# Patient Record
Sex: Female | Born: 1950 | Race: White | Hispanic: No | Marital: Married | Smoking: Never smoker
Health system: Southern US, Community
[De-identification: ages and names within clinical notes are randomized; demographics above are authoritative.]

## PROBLEM LIST (undated history)

## (undated) DIAGNOSIS — IMO0002 Reserved for concepts with insufficient information to code with codable children: Secondary | ICD-10-CM

## (undated) DIAGNOSIS — N83209 Unspecified ovarian cyst, unspecified side: Secondary | ICD-10-CM

## (undated) DIAGNOSIS — M810 Age-related osteoporosis without current pathological fracture: Secondary | ICD-10-CM

## (undated) DIAGNOSIS — E785 Hyperlipidemia, unspecified: Secondary | ICD-10-CM

## (undated) DIAGNOSIS — F419 Anxiety disorder, unspecified: Secondary | ICD-10-CM

## (undated) HISTORY — DX: Anxiety disorder, unspecified: F41.9

## (undated) HISTORY — PX: DERMOID CYST  EXCISION: SHX1452

## (undated) HISTORY — DX: Hyperlipidemia, unspecified: E78.5

## (undated) HISTORY — DX: Reserved for concepts with insufficient information to code with codable children: IMO0002

## (undated) HISTORY — DX: Unspecified ovarian cyst, unspecified side: N83.209

## (undated) HISTORY — PX: APPENDECTOMY: SHX54

## (undated) HISTORY — DX: Age-related osteoporosis without current pathological fracture: M81.0

## (undated) HISTORY — PX: TUBAL LIGATION: SHX77

---
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
## 1984-01-15 HISTORY — PX: BREAST LUMPECTOMY: SHX2

## 2001-09-01 ENCOUNTER — Other Ambulatory Visit: Admission: RE | Admit: 2001-09-01 | Discharge: 2001-09-01 | Payer: Self-pay | Admitting: Internal Medicine

## 2004-05-18 ENCOUNTER — Ambulatory Visit: Payer: Self-pay | Admitting: Internal Medicine

## 2005-06-07 ENCOUNTER — Ambulatory Visit: Payer: Self-pay | Admitting: Family Medicine

## 2005-07-29 ENCOUNTER — Ambulatory Visit: Payer: Self-pay | Admitting: Family Medicine

## 2005-07-30 ENCOUNTER — Encounter: Payer: Self-pay | Admitting: Family Medicine

## 2005-10-01 ENCOUNTER — Ambulatory Visit: Payer: Self-pay | Admitting: Family Medicine

## 2005-10-07 ENCOUNTER — Ambulatory Visit: Payer: Self-pay | Admitting: Professional

## 2005-10-22 ENCOUNTER — Ambulatory Visit: Payer: Self-pay | Admitting: Professional

## 2006-11-22 ENCOUNTER — Encounter: Payer: Self-pay | Admitting: Family Medicine

## 2007-02-02 ENCOUNTER — Encounter: Payer: Self-pay | Admitting: Family Medicine

## 2007-02-06 ENCOUNTER — Encounter (INDEPENDENT_AMBULATORY_CARE_PROVIDER_SITE_OTHER): Payer: Self-pay | Admitting: *Deleted

## 2007-02-13 ENCOUNTER — Encounter (INDEPENDENT_AMBULATORY_CARE_PROVIDER_SITE_OTHER): Payer: Self-pay | Admitting: *Deleted

## 2007-11-13 ENCOUNTER — Other Ambulatory Visit: Admission: RE | Admit: 2007-11-13 | Discharge: 2007-11-13 | Payer: Self-pay | Admitting: Internal Medicine

## 2007-11-13 ENCOUNTER — Encounter: Payer: Self-pay | Admitting: Internal Medicine

## 2007-11-13 ENCOUNTER — Ambulatory Visit: Payer: Self-pay | Admitting: Internal Medicine

## 2007-11-13 DIAGNOSIS — E785 Hyperlipidemia, unspecified: Secondary | ICD-10-CM

## 2007-11-13 DIAGNOSIS — F411 Generalized anxiety disorder: Secondary | ICD-10-CM | POA: Insufficient documentation

## 2007-11-16 LAB — CONVERTED CEMR LAB
Cholesterol: 288 mg/dL (ref 0–200)
Total CHOL/HDL Ratio: 4
Triglycerides: 165 mg/dL — ABNORMAL HIGH (ref 0–149)

## 2007-11-18 ENCOUNTER — Encounter (INDEPENDENT_AMBULATORY_CARE_PROVIDER_SITE_OTHER): Payer: Self-pay | Admitting: *Deleted

## 2007-12-09 ENCOUNTER — Ambulatory Visit: Payer: Self-pay | Admitting: Gastroenterology

## 2007-12-24 ENCOUNTER — Ambulatory Visit: Payer: Self-pay | Admitting: Gastroenterology

## 2008-03-16 ENCOUNTER — Encounter: Payer: Self-pay | Admitting: Internal Medicine

## 2008-03-21 ENCOUNTER — Encounter: Payer: Self-pay | Admitting: Internal Medicine

## 2008-07-21 ENCOUNTER — Ambulatory Visit: Payer: Self-pay | Admitting: Internal Medicine

## 2009-05-04 ENCOUNTER — Encounter: Payer: Self-pay | Admitting: Internal Medicine

## 2009-05-08 ENCOUNTER — Encounter: Payer: Self-pay | Admitting: Internal Medicine

## 2009-05-09 ENCOUNTER — Encounter: Payer: Self-pay | Admitting: Internal Medicine

## 2009-05-15 ENCOUNTER — Encounter: Payer: Self-pay | Admitting: Internal Medicine

## 2009-05-15 LAB — HM MAMMOGRAPHY: HM Mammogram: NORMAL

## 2009-11-07 ENCOUNTER — Encounter: Payer: Self-pay | Admitting: Internal Medicine

## 2009-11-20 ENCOUNTER — Encounter: Payer: Self-pay | Admitting: Internal Medicine

## 2010-02-13 NOTE — Letter (Signed)
Summary: Results Follow up Letter  [place] [facility]  [address] [place], [place] [address]   Phone: [phone]  Fax: [phone]    05/15/2009 MRN: [id number]  Charlotte Wright [address] [place], [place]  [address]  Dear Ms. Degrazia,  The following are the results of your recent test(s):  Test         Result    Pap Smear:        Normal _____  Not Normal _____ Comments: ______________________________________________________ Cholesterol: LDL(Bad cholesterol):         Your goal is less than:         HDL (Good cholesterol):       Your goal is more than: Comments:  ______________________________________________________ Mammogram:        Normal __X___  Not Normal _____ Comments: make sure that patient knows the follow up views looked good and were reassuring but they would like to check again in 6 months.  ___________________________________________________________________ Hemoccult:        Normal _____  Not normal _______ Comments:    _____________________________________________________________________ Other Tests:    We routinely do not discuss normal results over the telephone.  If you desire a copy of the results, or you have any questions about this information we can discuss them at your next office visit.   Sincerely,      Tillman Abide, MD

## 2010-03-20 ENCOUNTER — Telehealth: Payer: Self-pay | Admitting: Internal Medicine

## 2010-03-27 NOTE — Progress Notes (Signed)
Summary: pt needs medicine for travelor's diarrhea  Phone Note Call from Patient Call back at Home Phone [phone]   Caller: Patient Summary of Call: Pt is going to [place] the week of 3/19 and is asking that cipro be called in for travelor's diarrhea.  [facility]  Please let pt know when called in. Initial call taken by: Lowella Petties CMA, AAMA,  March 20, 2010 11:21 AM  Follow-up for Phone Call        okay to send cipro 250 two times a day  #14 x 0 Use only if symptoms develop--not as a preventative Follow-up by: Cindee Salt MD,  March 21, 2010 8:37 AM  Additional Follow-up for Phone Call Additional follow up Details #1::        Spoke with patient and advised results. rx sent to pharmacy. Additional Follow-up by: Mervin Hack CMA (AAMA),  March 21, 2010 10:24 AM    New/Updated Medications: CIPRO 250 MG TABS (CIPROFLOXACIN HCL) two times a day Prescriptions: CIPRO 250 MG TABS (CIPROFLOXACIN HCL) two times a day  #14 x 0   Entered by:   Mervin Hack CMA (AAMA)   Authorized by:   Cindee Salt MD   Signed by:   Mervin Hack CMA (AAMA) on 03/21/2010   Method used:   Electronically to        [id number] (retail[facility]       [address]       [place], [place]  [address]       Ph: [phone] or [phone]       Fax: [phone]   RxID:   [id number]

## 2010-05-25 ENCOUNTER — Encounter: Payer: Self-pay | Admitting: *Deleted

## 2010-05-25 ENCOUNTER — Encounter: Payer: Self-pay | Admitting: Internal Medicine

## 2011-06-05 ENCOUNTER — Encounter: Payer: Self-pay | Admitting: Internal Medicine

## 2011-06-06 ENCOUNTER — Telehealth: Payer: Self-pay | Admitting: Internal Medicine

## 2011-06-06 NOTE — Telephone Encounter (Signed)
I spoke to radiologist His concern is persistent sheets of microcalcifications on mammo prompting repeat diagnostics regularly No recent change and no cluster to warrant biopsy  BSGI has high specificity so unlikely to give false alarm and could be reassuring  Discussed with patient as well and she would like to proceed  Will okay the test now  Shirlee Limerick, Please have [facility] send me another order and I will sign

## 2011-06-06 NOTE — Telephone Encounter (Signed)
Dr.Cornella,Radiology,called to speak to you.  He wanted to talk to you about the questions you have about patient's order.  Please call Dr.Cornella on his cell phone [phone].

## 2011-06-15 HISTORY — PX: BREAST BIOPSY: SHX20

## 2011-06-20 ENCOUNTER — Other Ambulatory Visit: Payer: Self-pay | Admitting: Radiology

## 2011-06-26 ENCOUNTER — Encounter: Payer: Self-pay | Admitting: Internal Medicine

## 2011-08-15 ENCOUNTER — Encounter: Payer: Self-pay | Admitting: Internal Medicine

## 2011-08-15 DIAGNOSIS — IMO0002 Reserved for concepts with insufficient information to code with codable children: Secondary | ICD-10-CM

## 2011-08-15 HISTORY — DX: Reserved for concepts with insufficient information to code with codable children: IMO0002

## 2011-08-16 ENCOUNTER — Encounter: Payer: Self-pay | Admitting: Internal Medicine

## 2011-09-05 ENCOUNTER — Encounter: Payer: Self-pay | Admitting: Internal Medicine

## 2011-09-10 ENCOUNTER — Encounter: Payer: Self-pay | Admitting: Internal Medicine

## 2011-09-10 ENCOUNTER — Other Ambulatory Visit (HOSPITAL_COMMUNITY)
Admission: RE | Admit: 2011-09-10 | Discharge: 2011-09-10 | Disposition: A | Discharge status: Home / Self Care | Payer: BC Managed Care – PPO | Source: Ambulatory Visit | Attending: Internal Medicine | Admitting: Internal Medicine

## 2011-09-10 ENCOUNTER — Ambulatory Visit (INDEPENDENT_AMBULATORY_CARE_PROVIDER_SITE_OTHER): Payer: BC Managed Care – PPO | Admitting: Internal Medicine

## 2011-09-10 VITALS — BP 110/70 | HR 78 | Temp 98.1°F | Ht 66.0 in | Wt 166.0 lb

## 2011-09-10 DIAGNOSIS — Z01419 Encounter for gynecological examination (general) (routine) without abnormal findings: Secondary | ICD-10-CM | POA: Insufficient documentation

## 2011-09-10 DIAGNOSIS — Z0000 Encounter for general adult medical examination without abnormal findings: Secondary | ICD-10-CM

## 2011-09-10 DIAGNOSIS — E785 Hyperlipidemia, unspecified: Secondary | ICD-10-CM

## 2011-09-10 DIAGNOSIS — Z23 Encounter for immunization: Secondary | ICD-10-CM

## 2011-09-10 DIAGNOSIS — Z1151 Encounter for screening for human papillomavirus (HPV): Secondary | ICD-10-CM | POA: Insufficient documentation

## 2011-09-10 LAB — CBC WITH DIFFERENTIAL/PLATELET
Eosinophils Relative: 2.8 % (ref 0.0–5.0)
HCT: 37.7 % (ref 36.0–46.0)
Lymphs Abs: 1.7 10*3/uL (ref 0.7–4.0)
Monocytes Relative: 7.6 % (ref 3.0–12.0)
Platelets: 272 10*3/uL (ref 150.0–400.0)
WBC: 4.8 10*3/uL (ref 4.5–10.5)

## 2011-09-10 LAB — HEPATIC FUNCTION PANEL
AST: 18 U/L (ref 0–37)
Alkaline Phosphatase: 43 U/L (ref 39–117)
Bilirubin, Direct: 0 mg/dL (ref 0.0–0.3)
Total Bilirubin: 0.4 mg/dL (ref 0.3–1.2)

## 2011-09-10 LAB — LIPID PANEL
Cholesterol: 239 mg/dL — ABNORMAL HIGH (ref 0–200)
Total CHOL/HDL Ratio: 4
VLDL: 28.6 mg/dL (ref 0.0–40.0)

## 2011-09-10 LAB — BASIC METABOLIC PANEL
Chloride: 105 mEq/L (ref 96–112)
Creatinine, Ser: 0.8 mg/dL (ref 0.4–1.2)
Sodium: 140 mEq/L (ref 135–145)

## 2011-09-10 LAB — TSH: TSH: 0.97 u[IU]/mL (ref 0.35–5.50)

## 2011-09-10 NOTE — Assessment & Plan Note (Signed)
Will recheck with better lifestyle No meds

## 2011-09-10 NOTE — Patient Instructions (Signed)
Please check to see if your insurance covers the shingles vaccine (Zostavax)

## 2011-09-10 NOTE — Progress Notes (Signed)
Subjective:    Patient ID: Charlotte Wright, female    DOB: 1950/11/14, 61 y.o.   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    Allergies  Allergen Reactions  . Penicillins     REACTION: rash  . Sulfa Antibiotics Rash    Past Medical History  Diagnosis Date  . Anxiety   . Hyperlipidemia   . Ovarian cyst 1970's    right    Past Surgical History  Procedure Date  . Vaginal delivery     x5  . Tubal ligation   . Breast lumpectomy 1986    right    Family History  Problem Relation Age of Onset  . Cancer Neg Hx     colon  . Diabetes Neg Hx     History   Social History  . Marital Status: Married    Spouse Name: N/A    Number of Children: 5  . Years of Education: N/A   Occupational History  . English teacher --on leave now    Social History Main Topics  . Smoking status: Never Smoker   . Smokeless tobacco: Never Used  . 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 Drug Use: No  . Sexually Active: Not on file   Other Topics Concern  . 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       Wears seat belt Lots of gardening and yard work [employer] a lot  HENT: Negative for hearing loss, congestion, rhinorrhea, dental problem and tinnitus.        Regular with dentist  Eyes: Negative for visual disturbance.       No diplopia or unilateral vision loss    Respiratory: Negative for cough, chest tightness and shortness of breath.   Cardiovascular: Negative for chest pain, palpitations and leg swelling.  Gastrointestinal: Negative for nausea, vomiting, abdominal pain, constipation and blood in stool.       No heartburn  Genitourinary: Negative for dysuria, difficulty urinating and dyspareunia.  Musculoskeletal: Positive for arthralgias. Negative for back pain and joint swelling.       Mild left thumb pain  Skin: Negative for rash.       No suspicious lesions Very sensitive to bug bites  Neurological: Negative for dizziness, syncope, weakness, light-headedness, numbness and headaches.  Hematological: Negative for adenopathy. Does not bruise/bleed easily.  Psychiatric/Behavioral: Negative for disturbed wake/sleep cycle and dysphoric mood. The patient is not nervous/anxious.        Objective:   Physical Exam  Constitutional: She is oriented to person, place, and time. She appears well-developed and well-nourished. No distress.  HENT:  Head: Normocephalic and atraumatic.  Right Ear: External ear normal.  Left Ear: External ear normal.  Mouth/Throat: Oropharynx is clear and moist. No oropharyngeal exudate.  Eyes: Conjunctivae and EOM are normal.  Pupils are equal, round, and reactive to light.  Neck: Normal range of motion. Neck supple. No thyromegaly present.  Cardiovascular: Normal rate, regular rhythm, normal heart sounds and intact distal pulses.  Exam reveals no gallop.   No murmur heard. Pulmonary/Chest: Effort normal and breath sounds normal. No respiratory distress. She has no wheezes. She has no rales.  Abdominal: Soft. There is no tenderness.  Genitourinary:       Mild cystic changes in left breast 2 lumps laterally in right breast (site of biopsy in June)--with mild tenderness  Normal introitus Cervix appears normal---pap done Bimanual negative. Uterus is retroverted  Musculoskeletal: Normal range of motion. She exhibits no  edema and no tenderness.  Lymphadenopathy:    She has no cervical adenopathy.    She has no axillary adenopathy.  Neurological: She is alert and oriented to person, place, and time.  Skin: No rash noted. No erythema.  Psychiatric: She has a normal mood and affect. Her behavior is normal.          Assessment & Plan:

## 2011-09-10 NOTE — Assessment & Plan Note (Signed)
Doing well Will update Tdap She will look into zostavax Pap done Just had mammo and breast biopsy on right Abstinent from alcohol again and exercising regularly

## 2011-09-11 ENCOUNTER — Encounter: Payer: Self-pay | Admitting: *Deleted

## 2011-09-18 ENCOUNTER — Encounter: Payer: Self-pay | Admitting: Internal Medicine

## 2011-09-20 ENCOUNTER — Encounter: Payer: Self-pay | Admitting: *Deleted

## 2011-09-24 ENCOUNTER — Encounter: Payer: Self-pay | Admitting: *Deleted

## 2011-12-25 ENCOUNTER — Telehealth: Payer: Self-pay | Admitting: *Deleted

## 2011-12-25 NOTE — Telephone Encounter (Signed)
Left message with female that answered the phone and asked him to have pt return my call, need to make sure she knows of the benign findings on her mammo and she needs a 6 mth f/u. Per results from [facility] pt was already given the letter but I need to confirm.  Sending results to be scanned.

## 2012-01-07 ENCOUNTER — Encounter: Payer: Self-pay | Admitting: Internal Medicine

## 2012-01-13 ENCOUNTER — Ambulatory Visit (INDEPENDENT_AMBULATORY_CARE_PROVIDER_SITE_OTHER): Payer: BC Managed Care – PPO | Admitting: Internal Medicine

## 2012-01-13 ENCOUNTER — Encounter: Payer: Self-pay | Admitting: Internal Medicine

## 2012-01-13 VITALS — BP 120/72 | HR 74 | Temp 98.9°F | Resp 14 | Wt 158.0 lb

## 2012-01-13 DIAGNOSIS — J069 Acute upper respiratory infection, unspecified: Secondary | ICD-10-CM

## 2012-01-13 MED ORDER — HYDROCODONE-HOMATROPINE 5-1.5 MG/5ML PO SYRP: 5.0000 mL | ORAL_SOLUTION | Freq: Every evening | ORAL | Status: DC | PRN

## 2012-01-13 MED ORDER — AZITHROMYCIN 250 MG PO TABS: ORAL_TABLET | ORAL | Status: DC

## 2012-01-13 NOTE — Patient Instructions (Signed)
Please start the azithromycin antibiotic if you are worsening in the next few days

## 2012-01-13 NOTE — Assessment & Plan Note (Signed)
Severity suggests influenza despite the fact that she had the vaccine Now with bronchitis symptoms Will give hydrocodone for cough Azithromycin if worsening in the next few days

## 2012-01-13 NOTE — Progress Notes (Signed)
  Subjective:    Patient ID: Charlotte Wright, female    DOB: [birth year], 61 y.o.   MRN: [id number]  HPI Started with scratchy throat a week ago Aching in back also Then 4 days ago, woke with fever and headache Was in bed mostly for 3 days  Now with bad cough Especially bad at night Sleeping with vaporizer Awakening with bad cough  Still feels weak  Cough is mostly dry No apparent post nasal drip No sore throat Slight teeth pain but not in ears  Tried robitussin DM--didn't seem to help Did try some tylenol for sleep (PM) a couple of nights ago Headache is only low grade now Some chills  No current outpatient prescriptions on file prior to visit.    Allergies  Allergen Reactions  . Penicillins     REACTION: rash  . Sulfa Antibiotics Rash    Past Medical History  Diagnosis Date  . Anxiety   . Hyperlipidemia   . Ovarian cyst 1970's    right  . ASCUS favor benign 8/13    Past Surgical History  Procedure Date  . Vaginal delivery     x5  . Tubal ligation   . Breast lumpectomy 1986    right  . Breast biopsy 6/13    negative    Family History  Problem Relation Age of Onset  . Cancer Neg Hx     colon  . Diabetes Neg Hx     History   Social History  . Marital Status: Married    Spouse Name: N/A    Number of Children: 5  . Years of Education: N/A   Occupational History  . English teacher --on leave now    Social History Main Topics  . Smoking status: Never Smoker   . Smokeless tobacco: Never Used  . 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 Drug Use: No  . Sexually Active: Not on file   Other Topics Concern  . 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 No distress.  HENT:    Mouth/Throat: Oropharynx is clear and moist. No oropharyngeal exudate.       No sinus tenderness Only very mild nasal congestion on right TMs normal  Neck: Normal range of motion. Neck supple. No thyromegaly present.  Pulmonary/Chest: No respiratory distress. She has no wheezes. She has no rales.       Scattered rhonchi No dullness to percussion  Lymphadenopathy:    She has no cervical adenopathy.          Assessment & Plan:

## 2012-01-14 ENCOUNTER — Encounter: Payer: Self-pay | Admitting: Internal Medicine

## 2012-03-01 ENCOUNTER — Other Ambulatory Visit: Payer: Self-pay

## 2012-06-05 ENCOUNTER — Encounter: Payer: Self-pay | Admitting: Internal Medicine

## 2012-06-11 ENCOUNTER — Encounter: Payer: Self-pay | Admitting: *Deleted

## 2012-09-11 ENCOUNTER — Ambulatory Visit (INDEPENDENT_AMBULATORY_CARE_PROVIDER_SITE_OTHER): Payer: BC Managed Care – PPO | Admitting: Internal Medicine

## 2012-09-11 ENCOUNTER — Other Ambulatory Visit (HOSPITAL_COMMUNITY)
Admission: RE | Admit: 2012-09-11 | Discharge: 2012-09-11 | Disposition: A | Discharge status: Home / Self Care | Payer: BC Managed Care – PPO | Source: Ambulatory Visit | Attending: Internal Medicine | Admitting: Internal Medicine

## 2012-09-11 ENCOUNTER — Encounter: Payer: Self-pay | Admitting: Internal Medicine

## 2012-09-11 VITALS — BP 122/70 | HR 57 | Temp 98.4°F | Ht 66.0 in | Wt 154.0 lb

## 2012-09-11 DIAGNOSIS — E785 Hyperlipidemia, unspecified: Secondary | ICD-10-CM

## 2012-09-11 DIAGNOSIS — Z01419 Encounter for gynecological examination (general) (routine) without abnormal findings: Secondary | ICD-10-CM | POA: Insufficient documentation

## 2012-09-11 DIAGNOSIS — Z0000 Encounter for general adult medical examination without abnormal findings: Secondary | ICD-10-CM

## 2012-09-11 MED ORDER — ZOSTER VACCINE LIVE 19400 UNT/0.65ML ~~LOC~~ SOLR: 0.6500 mL | Freq: Once | SUBCUTANEOUS | Status: DC

## 2012-09-11 NOTE — Addendum Note (Signed)
Addended by: Sueanne Margarita on: 09/11/2012 12:50 PM   Modules accepted: Orders

## 2012-09-11 NOTE — Assessment & Plan Note (Signed)
Discussed primary prevention with statins---she doesn't want meds Will defer labs this year

## 2012-09-11 NOTE — Assessment & Plan Note (Signed)
Healthy UTD on preventative care Pap redone due to low risk ASCUS last year

## 2012-09-11 NOTE — Progress Notes (Signed)
Subjective:    Patient ID: Charlotte Wright, female    DOB: 05/07/1950, 62 y.o.   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   No current facility-administered medications on file prior to visit.    Allergies  Allergen Reactions  . Penicillins     REACTION: rash  . Sulfa Antibiotics Rash    Past Medical History  Diagnosis Date  . Anxiety   . Hyperlipidemia   . Ovarian cyst 1970's    right  . ASCUS favor benign 8/13    Past Surgical History  Procedure Laterality Date  . Vaginal delivery      x5  . Tubal ligation    . Breast lumpectomy  1986    right  . Breast biopsy  6/13    negative    Family History  Problem Relation Age of Onset  . Cancer Neg Hx     colon  . Diabetes Neg Hx     History   Social History  . Marital Status: Married    Spouse Name: N/A    Number of Children: 5  . Years of Education: N/A   Occupational History  . Retail buyer --retired    Social History Main Topics  . Smoking status: Never Smoker   . Smokeless tobacco: Never Used  . 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 Drug Use: No  . Sexual Activity: Not on file   Other Topics Concern  . 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      Finished Master's in [place]   Review of Systems  Constitutional: Negative for fatigue and unexpected weight change.       Wears seat belt  HENT: Negative for hearing loss, congestion, rhinorrhea, dental problem and tinnitus.        Regular with dentist  Eyes: Negative for visual disturbance.       No diplopia or unilateral vision loss  Respiratory: Negative for cough, chest tightness and shortness of breath.    Cardiovascular: Negative for chest pain, palpitations and leg swelling.  Gastrointestinal: Negative for nausea, vomiting, abdominal pain, constipation and blood in stool.       No heartburn  Endocrine: Negative for cold intolerance and heat intolerance.  Genitourinary: Negative for dysuria, hematuria and dyspareunia.       Very slight after voiding incontinence---wears thin pad   Musculoskeletal: Positive for back pain. Negative for joint swelling and arthralgias.       Gets some back pain after yard work, etc Now noting some nodules on DIPs---not painful  Skin: Negative for rash.       No suspicious lesions  Allergic/Immunologic: Negative for environmental allergies and immunocompromised state.  Neurological: Negative for dizziness, syncope, weakness, light-headedness, numbness and headaches.  Hematological: Negative for adenopathy. Bruises/bleeds easily.       Skin is sensitive to minor trauma  Psychiatric/Behavioral: Negative for sleep disturbance and dysphoric mood. The patient is not nervous/anxious.        Objective:   Physical Exam  Constitutional: She is oriented to person, place, and time. She appears well-developed and well-nourished. No distress.  HENT:  Head: Normocephalic and atraumatic.  Right  Ear: External ear normal.  Left Ear: External ear normal.  Mouth/Throat: Oropharynx is clear and moist. No oropharyngeal exudate.  Eyes: Conjunctivae and EOM are normal. Pupils are equal, round, and reactive to light.  Neck: Normal range of motion. Neck supple. No thyromegaly present.  Cardiovascular: Normal rate, regular rhythm, normal heart sounds and intact distal pulses.  Exam reveals no gallop.   No murmur heard. Pulmonary/Chest: Effort normal and breath sounds normal. No respiratory distress. She has no wheezes. She has no rales.  Genitourinary:  Marked cystic changes in both breasts---even deep  Normal introitus Cervix normal Pap done  Musculoskeletal: She exhibits  no edema and no tenderness.  Lymphadenopathy:    She has no cervical adenopathy.    She has no axillary adenopathy.  Neurological: She is alert and oriented to person, place, and time.  Skin: No rash noted. No erythema.  Psychiatric: She has a normal mood and affect. Her behavior is normal.          Assessment & Plan:

## 2012-11-19 ENCOUNTER — Other Ambulatory Visit: Payer: Self-pay

## 2013-06-11 ENCOUNTER — Encounter: Payer: Self-pay | Admitting: Internal Medicine

## 2013-09-13 ENCOUNTER — Encounter: Payer: Self-pay | Admitting: Internal Medicine

## 2013-09-13 ENCOUNTER — Ambulatory Visit (INDEPENDENT_AMBULATORY_CARE_PROVIDER_SITE_OTHER): Payer: BC Managed Care – PPO | Admitting: Internal Medicine

## 2013-09-13 VITALS — BP 122/70 | HR 64 | Temp 98.1°F | Ht 66.0 in | Wt 150.0 lb

## 2013-09-13 DIAGNOSIS — Z0000 Encounter for general adult medical examination without abnormal findings: Secondary | ICD-10-CM

## 2013-09-13 DIAGNOSIS — E785 Hyperlipidemia, unspecified: Secondary | ICD-10-CM

## 2013-09-13 DIAGNOSIS — B079 Viral wart, unspecified: Secondary | ICD-10-CM

## 2013-09-13 LAB — COMPREHENSIVE METABOLIC PANEL
ALBUMIN: 4.5 g/dL (ref 3.5–5.2)
ALT: 14 U/L (ref 0–35)
AST: 21 U/L (ref 0–37)
Alkaline Phosphatase: 38 U/L — ABNORMAL LOW (ref 39–117)
BUN: 10 mg/dL (ref 6–23)
CALCIUM: 9.6 mg/dL (ref 8.4–10.5)
CHLORIDE: 102 meq/L (ref 96–112)
CO2: 27 mEq/L (ref 19–32)
Creatinine, Ser: 0.8 mg/dL (ref 0.4–1.2)
GFR: 72.94 mL/min (ref >60.00)
GLUCOSE: 88 mg/dL (ref 70–99)
POTASSIUM: 4 meq/L (ref 3.5–5.1)
Sodium: 138 mEq/L (ref 135–145)
TOTAL PROTEIN: 7.2 g/dL (ref 6.0–8.3)
Total Bilirubin: 0.7 mg/dL (ref 0.2–1.2)

## 2013-09-13 LAB — CBC WITH DIFFERENTIAL/PLATELET
BASOS PCT: 0.6 % (ref 0.0–3.0)
Basophils Absolute: 0 10*3/uL (ref 0.0–0.1)
EOS ABS: 0.1 10*3/uL (ref 0.0–0.7)
EOS PCT: 1.2 % (ref 0.0–5.0)
HCT: 37.9 % (ref 36.0–46.0)
Hemoglobin: 12.7 g/dL (ref 12.0–15.0)
LYMPHS PCT: 36.1 % (ref 12.0–46.0)
Lymphs Abs: 1.6 10*3/uL (ref 0.7–4.0)
MCHC: 33.5 g/dL (ref 30.0–36.0)
MCV: 97.5 fl (ref 78.0–100.0)
Monocytes Absolute: 0.4 10*3/uL (ref 0.1–1.0)
Monocytes Relative: 9.7 % (ref 3.0–12.0)
NEUTROS PCT: 52.4 % (ref 43.0–77.0)
Neutro Abs: 2.3 10*3/uL (ref 1.4–7.7)
PLATELETS: 279 10*3/uL (ref 150.0–400.0)
RBC: 3.89 Mil/uL (ref 3.87–5.11)
RDW: 12.9 % (ref 11.5–15.5)
WBC: 4.5 10*3/uL (ref 4.0–10.5)

## 2013-09-13 LAB — LIPID PANEL
CHOLESTEROL: 250 mg/dL — AB (ref 0–200)
HDL: 67.2 mg/dL (ref >39.00)
LDL CALC: 159 mg/dL — AB (ref 0–99)
NonHDL: 182.8
TRIGLYCERIDES: 121 mg/dL (ref 0.0–149.0)
Total CHOL/HDL Ratio: 4
VLDL: 24.2 mg/dL (ref 0.0–40.0)

## 2013-09-13 NOTE — Addendum Note (Signed)
Addended by: Sueanne Margarita on: 09/13/2013 12:26 PM   Modules accepted: Orders

## 2013-09-13 NOTE — Assessment & Plan Note (Signed)
Discussed primary prevention No statin now

## 2013-09-13 NOTE — Assessment & Plan Note (Signed)
Healthy Good lifestyle Flu shot today mammo every 2 years--due 2017 Colon due 2019 Pap 2017--probably last one

## 2013-09-13 NOTE — Progress Notes (Signed)
Subjective:    Patient ID: Charlotte Wright, female    DOB: 04/30/1950, 63 y.o.   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 ?corn or wart Only hurts if she picks at it Similar one on right hand  Remains abstinent Goes to weekly [employer] meeting in [facility] regularly Busy watching grandkids [place] to [place]-- not doing much there this summer Still on Weight Watchers--goes monthly  No current outpatient prescriptions on file prior to visit.   No current facility-administered medications on file prior to visit.    Allergies  Allergen Reactions  . Penicillins     REACTION: rash  . Sulfa Antibiotics Rash    Past Medical History  Diagnosis Date  . Anxiety   . Hyperlipidemia   . Ovarian cyst 1970's    right  . ASCUS favor benign 8/13    Past Surgical History  Procedure Laterality Date  . Vaginal delivery      x5  . Tubal ligation    . Breast lumpectomy  1986    right  . Breast biopsy  6/13    negative    Family History  Problem Relation Age of Onset  . Cancer Neg Hx     colon  . Diabetes Neg Hx     History   Social History  . Marital Status: Married    Spouse Name: N/A    Number of Children: 5  . Years of Education: N/A   Occupational History  . Retail buyer --retired    Social History Main Topics  . Smoking status: Never Smoker   . Smokeless tobacco: Never Used  . 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 Drug Use: No  . Sexual Activity: Not on file   Other Topics Concern  . 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      Finished Master's in [place]   Review of Systems  Constitutional: Negative for fatigue and unexpected weight change.       Wears seat belt  HENT: Negative for hearing loss  and tinnitus.        Regular with dentist  Eyes: Negative for visual disturbance.       No diplopia or unilateral vision loss  Respiratory: Negative for cough, chest tightness and shortness of breath.   Cardiovascular: Negative for chest pain, palpitations and leg swelling.  Gastrointestinal: Negative for nausea, vomiting, abdominal pain, constipation and blood in stool.       No heartburn  Endocrine: Negative for polydipsia and polyphagia.  Genitourinary: Negative for frequency, hematuria, difficulty urinating and dyspareunia.  Musculoskeletal: Positive for back pain. Negative for arthralgias and joint swelling.       Mild back soreness after taking care of kids  Skin: Negative for rash.       Scaly small spot on right cheek---no change in over a year  Allergic/Immunologic: Negative for environmental allergies and immunocompromised state.  Neurological: Negative for dizziness, syncope, weakness, light-headedness, numbness and headaches.  Hematological: Negative for adenopathy. Does not bruise/bleed easily.  Psychiatric/Behavioral: Negative for sleep disturbance and dysphoric mood. The patient is not nervous/anxious.        Objective:   Physical Exam  Constitutional: She appears well-developed  and well-nourished. No distress.  HENT:  Head: Normocephalic and atraumatic.  Right Ear: External ear normal.  Left Ear: External ear normal.  Mouth/Throat: Oropharynx is clear and moist. No oropharyngeal exudate.  Eyes: Conjunctivae and EOM are normal. Pupils are equal, round, and reactive to light.  Neck: Normal range of motion. Neck supple. No thyromegaly present.  Cardiovascular: Normal rate, regular rhythm, normal heart sounds and intact distal pulses.  Exam reveals no gallop.   No murmur heard. Pulmonary/Chest: Effort normal and breath sounds normal. No respiratory distress. She has no wheezes. She has no rales.  Abdominal: Soft. There is no tenderness.  Genitourinary:  Mild bilateral  cystic changes in breasts  Musculoskeletal: She exhibits no edema and no tenderness.  Lymphadenopathy:    She has no cervical adenopathy.  Skin: No rash noted.  2-62mm warts on right 3rd toe and right 5th finger  Psychiatric: She has a normal mood and affect. Her behavior is normal.          Assessment & Plan:

## 2013-09-13 NOTE — Progress Notes (Signed)
Pre visit review using our clinic review tool, if applicable. No additional management support is needed unless otherwise documented below in the visit note. 

## 2013-09-13 NOTE — Assessment & Plan Note (Signed)
2 lesions treated for 45 seconds x 2 Tolerated well Discussed home care

## 2013-09-14 LAB — T4, FREE: FREE T4: 0.84 ng/dL (ref 0.60–1.60)

## 2013-09-30 ENCOUNTER — Encounter: Payer: Self-pay | Admitting: Gastroenterology

## 2014-05-21 ENCOUNTER — Encounter: Payer: Self-pay | Admitting: Emergency Medicine

## 2014-05-21 ENCOUNTER — Other Ambulatory Visit: Payer: Self-pay

## 2014-05-21 ENCOUNTER — Emergency Department
Admission: EM | Admit: 2014-05-21 | Discharge: 2014-05-22 | Disposition: A | Discharge status: Home / Self Care | Payer: BC Managed Care – PPO | Attending: Emergency Medicine | Admitting: Emergency Medicine

## 2014-05-21 DIAGNOSIS — S4991XA Unspecified injury of right shoulder and upper arm, initial encounter: Secondary | ICD-10-CM | POA: Diagnosis not present

## 2014-05-21 DIAGNOSIS — Z880 Allergy status to penicillin: Secondary | ICD-10-CM | POA: Insufficient documentation

## 2014-05-21 DIAGNOSIS — X58XXXA Exposure to other specified factors, initial encounter: Secondary | ICD-10-CM | POA: Insufficient documentation

## 2014-05-21 DIAGNOSIS — R55 Syncope and collapse: Secondary | ICD-10-CM | POA: Insufficient documentation

## 2014-05-21 DIAGNOSIS — Y9389 Activity, other specified: Secondary | ICD-10-CM | POA: Insufficient documentation

## 2014-05-21 DIAGNOSIS — Y998 Other external cause status: Secondary | ICD-10-CM | POA: Diagnosis not present

## 2014-05-21 DIAGNOSIS — E860 Dehydration: Secondary | ICD-10-CM | POA: Diagnosis not present

## 2014-05-21 DIAGNOSIS — Y9289 Other specified places as the place of occurrence of the external cause: Secondary | ICD-10-CM | POA: Insufficient documentation

## 2014-05-21 DIAGNOSIS — M25511 Pain in right shoulder: Secondary | ICD-10-CM

## 2014-05-21 LAB — COMPREHENSIVE METABOLIC PANEL
ALBUMIN: 4.6 g/dL (ref 3.5–5.0)
ALT: 15 U/L (ref 14–54)
AST: 22 U/L (ref 15–41)
Alkaline Phosphatase: 38 U/L (ref 38–126)
Anion gap: 10 (ref 5–15)
BUN: 23 mg/dL — AB (ref 6–20)
CALCIUM: 9.2 mg/dL (ref 8.9–10.3)
CO2: 25 mmol/L (ref 22–32)
CREATININE: 0.87 mg/dL (ref 0.44–1.00)
Chloride: 104 mmol/L (ref 101–111)
GFR calc Af Amer: >60 mL/min (ref >60)
GFR calc non Af Amer: >60 mL/min (ref >60)
Glucose, Bld: 92 mg/dL (ref 65–99)
Potassium: 3.8 mmol/L (ref 3.5–5.1)
Sodium: 139 mmol/L (ref 135–145)
Total Bilirubin: 0.5 mg/dL (ref 0.3–1.2)
Total Protein: 7 g/dL (ref 6.5–8.1)

## 2014-05-21 LAB — CBC WITH DIFFERENTIAL/PLATELET
Basophils Absolute: 0.1 10*3/uL (ref 0–0.1)
Basophils Relative: 1 %
EOS ABS: 0.1 10*3/uL (ref 0–0.7)
Eosinophils Relative: 1 %
HEMATOCRIT: 34.9 % — AB (ref 35.0–47.0)
HEMOGLOBIN: 12 g/dL (ref 12.0–16.0)
Lymphocytes Relative: 19 %
Lymphs Abs: 1.5 10*3/uL (ref 1.0–3.6)
MCH: 33.1 pg (ref 26.0–34.0)
MCHC: 34.2 g/dL (ref 32.0–36.0)
MCV: 96.6 fL (ref 80.0–100.0)
MONOS PCT: 4 %
Monocytes Absolute: 0.4 10*3/uL (ref 0.2–0.9)
NEUTROS PCT: 75 %
Neutro Abs: 6.1 10*3/uL (ref 1.4–6.5)
Platelets: 272 10*3/uL (ref 150–440)
RBC: 3.61 MIL/uL — ABNORMAL LOW (ref 3.80–5.20)
RDW: 13 % (ref 11.5–14.5)
WBC: 8 10*3/uL (ref 3.6–11.0)

## 2014-05-21 LAB — URINALYSIS COMPLETE WITH MICROSCOPIC (ARMC ONLY)
Bilirubin Urine: NEGATIVE
Glucose, UA: NEGATIVE mg/dL
KETONES UR: NEGATIVE mg/dL
Nitrite: NEGATIVE
Protein, ur: NEGATIVE mg/dL
Specific Gravity, Urine: 1.012 (ref 1.005–1.030)
pH: 5 (ref 5.0–8.0)

## 2014-05-21 LAB — GLUCOSE, CAPILLARY: GLUCOSE-CAPILLARY: 87 mg/dL (ref 70–99)

## 2014-05-21 LAB — TROPONIN I

## 2014-05-21 MED ORDER — SODIUM CHLORIDE 0.9 % IV SOLN
Freq: Once | INTRAVENOUS | Status: AC
  Administered 2014-05-21: 1000 mL via INTRAVENOUS

## 2014-05-21 NOTE — ED Notes (Signed)
Pt BIB [facility] from a festival where she suffered syncopal episode, unsure how long she was unconscious. Per EMS, pt has also had 2 weeks of back pain radiating to her R shoulder relieved by motrin. Per EMS, pt CBG 75 and NSR with BP 119/60 and HR 70s. Pt states she was feeling faint and nauseated and then she sat down, then she woke up and had passed out. She reports she was sweating.

## 2014-05-21 NOTE — Discharge Instructions (Signed)
Dehydration, Adult Dehydration is when you lose more fluids from the body than you take in. Vital organs like the kidneys, brain, and heart cannot function without a proper amount of fluids and salt. Any loss of fluids from the body can cause dehydration.  CAUSES   Vomiting.  Diarrhea.  Excessive sweating.  Excessive urine output.  Fever. SYMPTOMS  Mild dehydration  Thirst.  Dry lips.  Slightly dry mouth. Moderate dehydration  Very dry mouth.  Sunken eyes.  Skin does not bounce back quickly when lightly pinched and released.  Dark urine and decreased urine production.  Decreased tear production.  Headache. Severe dehydration  Very dry mouth.  Extreme thirst.  Rapid, weak pulse (more than 100 beats per minute at rest).  Cold hands and feet.  Not able to sweat in spite of heat and temperature.  Rapid breathing.  Blue lips.  Confusion and lethargy.  Difficulty being awakened.  Minimal urine production.  No tears. DIAGNOSIS  Your caregiver will diagnose dehydration based on your symptoms and your exam. Blood and urine tests will help confirm the diagnosis. The diagnostic evaluation should also identify the cause of dehydration. TREATMENT  Treatment of mild or moderate dehydration can often be done at home by increasing the amount of fluids that you drink. It is best to drink small amounts of fluid more often. Drinking too much at one time can make vomiting worse. Refer to the home care instructions below. Severe dehydration needs to be treated at the hospital where you will probably be given intravenous (IV) fluids that contain water and electrolytes. HOME CARE INSTRUCTIONS   Ask your caregiver about specific rehydration instructions.  Drink enough fluids to keep your urine clear or pale yellow.  Drink small amounts frequently if you have nausea and vomiting.  Eat as you normally do.  Avoid:  Foods or drinks high in sugar.  Carbonated  drinks.  Juice.  Extremely hot or cold fluids.  Drinks with caffeine.  Fatty, greasy foods.  Alcohol.  Tobacco.  Overeating.  Gelatin desserts.  Wash your hands well to avoid spreading bacteria and viruses.  Only take over-the-counter or prescription medicines for pain, discomfort, or fever as directed by your caregiver.  Ask your caregiver if you should continue all prescribed and over-the-counter medicines.  Keep all follow-up appointments with your caregiver. SEEK MEDICAL CARE IF:  You have abdominal pain and it increases or stays in one area (localizes).  You have a rash, stiff neck, or severe headache.  You are irritable, sleepy, or difficult to awaken.  You are weak, dizzy, or extremely thirsty. SEEK IMMEDIATE MEDICAL CARE IF:   You are unable to keep fluids down or you get worse despite treatment.  You have frequent episodes of vomiting or diarrhea.  You have blood or green matter (bile) in your vomit.  You have blood in your stool or your stool looks black and tarry.  You have not urinated in 6 to 8 hours, or you have only urinated a small amount of very dark urine.  You have a fever.  You faint. MAKE SURE YOU:   Understand these instructions.  Will watch your condition.  Will get help right away if you are not doing well or get worse. Document Released: 12/31/2004 Document Revised: 03/25/2011 Document Reviewed: 08/20/2010 [facility] Patient Information 2015 [place], [place]. This information is not intended to replace advice given to you by your health care provider. Make sure you discuss any questions you have with your health care  provider.  Musculoskeletal Pain Musculoskeletal pain is muscle and boney aches and pains. These pains can occur in any part of the body. Your caregiver may treat you without knowing the cause of the pain. They may treat you if blood or urine tests, X-rays, and other tests were normal.  CAUSES There is often not a  definite cause or reason for these pains. These pains may be caused by a type of germ (virus). The discomfort may also come from overuse. Overuse includes working out too hard when your body is not fit. Boney aches also come from weather changes. Bone is sensitive to atmospheric pressure changes. HOME CARE INSTRUCTIONS   Ask when your test results will be ready. Make sure you get your test results.  Only take over-the-counter or prescription medicines for pain, discomfort, or fever as directed by your caregiver. If you were given medications for your condition, do not drive, operate machinery or power tools, or sign legal documents for 24 hours. Do not drink alcohol. Do not take sleeping pills or other medications that may interfere with treatment.  Continue all activities unless the activities cause more pain. When the pain lessens, slowly resume normal activities. Gradually increase the intensity and duration of the activities or exercise.  During periods of severe pain, bed rest may be helpful. Lay or sit in any position that is comfortable.  Putting ice on the injured area.  Put ice in a bag.  Place a towel between your skin and the bag.  Leave the ice on for 15 to 20 minutes, 3 to 4 times a day.  Follow up with your caregiver for continued problems and no reason can be found for the pain. If the pain becomes worse or does not go away, it may be necessary to repeat tests or do additional testing. Your caregiver may need to look further for a possible cause. SEEK IMMEDIATE MEDICAL CARE IF:  You have pain that is getting worse and is not relieved by medications.  You develop chest pain that is associated with shortness or breath, sweating, feeling sick to your stomach (nauseous), or throw up (vomit).  Your pain becomes localized to the abdomen.  You develop any new symptoms that seem different or that concern you. MAKE SURE YOU:   Understand these instructions.  Will watch your  condition.  Will get help right away if you are not doing well or get worse. Document Released: 12/31/2004 Document Revised: 03/25/2011 Document Reviewed: 09/04/2012 [facility] Patient Information 2015 [place]. This information is not intended to replace advice given to you by your health care provider. Make sure you discuss any questions you have with your health care provider.  Syncope Syncope is a medical term for fainting or passing out. This means you lose consciousness and drop to the ground. People are generally unconscious for less than 5 minutes. You may have some muscle twitches for up to 15 seconds before waking up and returning to normal. Syncope occurs more often in older adults, but it can happen to anyone. While most causes of syncope are not dangerous, syncope can be a sign of a serious medical problem. It is important to seek medical care.  CAUSES  Syncope is caused by a sudden drop in blood flow to the brain. The specific cause is often not determined. Factors that can bring on syncope include:  Taking medicines that lower blood pressure.  Sudden changes in posture, such as standing up quickly.  Taking more medicine than prescribed.  Standing in one place for too long.  Seizure disorders.  Dehydration and excessive exposure to heat.  Low blood sugar (hypoglycemia).  Straining to have a bowel movement.  Heart disease, irregular heartbeat, or other circulatory problems.  Fear, emotional distress, seeing blood, or severe pain. SYMPTOMS  Right before fainting, you may:  Feel dizzy or light-headed.  Feel nauseous.  See all white or all black in your field of vision.  Have cold, clammy skin. DIAGNOSIS  Your health care provider will ask about your symptoms, perform a physical exam, and perform an electrocardiogram (ECG) to record the electrical activity of your heart. Your health care provider may also perform other heart or blood tests to determine the  cause of your syncope which may include:  Transthoracic echocardiogram (TTE). During echocardiography, sound waves are used to evaluate how blood flows through your heart.  Transesophageal echocardiogram (TEE).  Cardiac monitoring. This allows your health care provider to monitor your heart rate and rhythm in real time.  Holter monitor. This is a portable device that records your heartbeat and can help diagnose heart arrhythmias. It allows your health care provider to track your heart activity for several days, if needed.  Stress tests by exercise or by giving medicine that makes the heart beat faster. TREATMENT  In most cases, no treatment is needed. Depending on the cause of your syncope, your health care provider may recommend changing or stopping some of your medicines. HOME CARE INSTRUCTIONS  Have someone stay with you until you feel stable.  Do not drive, use machinery, or play sports until your health care provider says it is okay.  Keep all follow-up appointments as directed by your health care provider.  Lie down right away if you start feeling like you might faint. Breathe deeply and steadily. Wait until all the symptoms have passed.  Drink enough fluids to keep your urine clear or pale yellow.  If you are taking blood pressure or heart medicine, get up slowly and take several minutes to sit and then stand. This can reduce dizziness. SEEK IMMEDIATE MEDICAL CARE IF:   You have a severe headache.  You have unusual pain in the chest, abdomen, or back.  You are bleeding from your mouth or rectum, or you have black or tarry stool.  You have an irregular or very fast heartbeat.  You have pain with breathing.  You have repeated fainting or seizure-like jerking during an episode.  You faint when sitting or lying down.  You have confusion.  You have trouble walking.  You have severe weakness.  You have vision problems. If you fainted, call your local emergency  services (911 in [place] Do not drive yourself to the hospital.  MAKE SURE YOU:  Understand these instructions.  Will watch your condition.  Will get help right away if you are not doing well or get worse. Document Released: 12/31/2004 Document Revised: 01/05/2013 Document Reviewed: 03/01/2011 [facility] Patient Information 2015 [place]. This information is not intended to replace advice given to you by your health care provider. Make sure you discuss any questions you have with your health care provider.

## 2014-05-21 NOTE — ED Provider Notes (Signed)
[facility] Provider Note  ____________________________________________  Time seen: 9:15 PM  I have reviewed the triage vital signs and the nursing notes.   HISTORY  Chief Complaint Near Syncope and Shoulder Pain    HPI Charlotte Wright is a 64 y.o. female who comes to the ED tonight complaining of syncope. She notes that she has had severe right shoulder pain for the last 2 weeks after lifting lawn furniture over her head while mowing the lawn. The pain has been ongoing despite taking NSAIDs and using a heating pad. Today, she was exercising her right shoulder causing increasingly severe pain, while also spending the entire day outside in hot weather at a music festival. At one point, she became lightheaded and sat down, and then passed out. No significant trauma or other head injury. No preceding chest pain, shortness of breath, abdominal pain, headache, back pain. Afterward she did not have any significant symptoms except for fatigue.     Past Medical History  Diagnosis Date  . Anxiety   . Hyperlipidemia   . Ovarian cyst 1970's    right  . ASCUS favor benign 8/13    Patient Active Problem List   Diagnosis Date Noted  . Warts 09/13/2013  . Routine general medical examination at a health care facility 09/10/2011  . HYPERLIPIDEMIA 11/13/2007  . ANXIETY 11/13/2007    Past Surgical History  Procedure Laterality Date  . Vaginal delivery      x5  . Tubal ligation    . Breast lumpectomy  1986    right  . Breast biopsy  6/13    negative    No current outpatient prescriptions on file.  Allergies Penicillins and Sulfa antibiotics  Family History  Problem Relation Age of Onset  . Multiple sclerosis Mother   . Asthma Brother     Social History History  Substance Use Topics  . Smoking status: Never Smoker   . Smokeless tobacco: Never Used  . 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 No weight changes Eyes:No blurry vision or double vision.  ENT: No sore throat. Cardiovascular: No chest pain. Respiratory: No dyspnea or cough. Gastrointestinal: Negative for abdominal pain, vomiting and diarrhea.  No BRBPR or melena. Genitourinary: Negative for dysuria, urinary retention, bloody urine, or difficulty urinating. Musculoskeletal: Positive right paraspinous thoracic back pain Skin: Negative for rash. Neurological: Negative for headaches, focal weakness or numbness. Psychiatric:No anxiety or depression.   Endocrine:No hot/cold intolerance, changes in energy, or sleep difficulty.  10-point ROS otherwise negative.  ____________________________________________   PHYSICAL EXAM:  VITAL SIGNS: ED Triage Vitals  Enc Vitals Group     BP 05/21/14 2059 115/79 mmHg     Pulse Rate 05/21/14 2059 67     Resp 05/21/14 2145 12     Temp 05/21/14 2059 98.2 F (36.8 C)     Temp Source 05/21/14 2059 Oral     SpO2 05/21/14 2059 100 %     Weight 05/21/14 2059 145 lb (65.772 kg)     Height 05/21/14 2059 5\' 6"  (1.676 m)     Head Cir --      Peak Flow --      Pain Score 05/21/14 2145 1     Pain Loc --      Pain Edu? --      Excl. in GC? --  Constitutional: Alert and oriented. Well appearing and in no distress. Eyes: No scleral icterus. No conjunctival pallor. PERRL. EOMI ENT   Head: Normocephalic and atraumatic.   Nose: No congestion/rhinnorhea. No septal hematoma   Mouth/Throat: MMM, no pharyngeal erythema   Neck: No stridor. No SubQ emphysema.  Hematological/Lymphatic/Immunilogical: No cervical lymphadenopathy. Cardiovascular: RRR. Normal and symmetric distal pulses are present in all extremities. No murmurs, rubs, or gallops. Respiratory: Normal respiratory effort without tachypnea nor retractions. Breath sounds are clear and equal bilaterally. No wheezes/rales/rhonchi. Gastrointestinal: Soft  and nontender. No distention. There is no CVA tenderness.  No rebound, rigidity, or guarding. Genitourinary: deferred Musculoskeletal: There is point tenderness with muscle spasm in the right subscapularis muscle. This is worsened with range of motion of the right shoulder. This reproduces her shoulder pain. No joint effusions.  No lower extremity tenderness.  No edema. Neurologic:   Normal speech and language.  CN 2-10 normal. Motor grossly intact. No pronator drift.  Normal gait. No gross focal neurologic deficits are appreciated.  Skin:  Skin is warm, dry and intact. No rash noted.  No petechiae, purpura, or bullae. Psychiatric: Mood and affect are normal. Speech and behavior are normal. Patient exhibits appropriate insight and judgment.  ____________________________________________    LABS (pertinent positives/negatives) (all labs ordered are listed, but only abnormal results are displayed) Labs Reviewed  COMPREHENSIVE METABOLIC PANEL - Abnormal; Notable for the following:    BUN 23 (*)    All other components within normal limits  URINALYSIS COMPLETEWITH MICROSCOPIC ([facility])  - Abnormal; Notable for the following:    Color, Urine YELLOW (*)    APPearance CLEAR (*)    Hgb urine dipstick 1+ (*)    Leukocytes, UA 1+ (*)    Bacteria, UA RARE (*)    Squamous Epithelial / LPF 0-5 (*)    All other components within normal limits  CBC WITH DIFFERENTIAL/PLATELET - Abnormal; Notable for the following:    RBC 3.61 (*)    HCT 34.9 (*)    All other components within normal limits  TROPONIN I  GLUCOSE, CAPILLARY  POCT CBG (FASTING - GLUCOSE)-MANUAL ENTRY   ____________________________________________   EKG  Normal sinus rhythm rate of 67. Normal axis, normal intervals, normal QRS, ST segments, and T waves.  ____________________________________________     RADIOLOGY    ____________________________________________   PROCEDURES  ____________________________________________   INITIAL IMPRESSION / ASSESSMENT AND PLAN / ED COURSE  Pertinent labs & imaging results that were available during my care of the patient were reviewed by me and considered in my medical decision making (see chart for details).  The patient presents with syncope, which is most likely related to 1)her severe shoulder pain that it is musculoskeletal in origin and exacerbated today by increased strain, and 2) dehydration related to prolonged exposure to hot weather outside. Have low suspicion of ACS, stroke, ICH, thoracic aortic dissection, AAA, carditis, sepsis, pneumothorax, PE. We'll check some blood work and give her IV fluids.  ----------------------------------------- 11:50 PM on 05/21/2014 -----------------------------------------  Vital signs remained totally stable in the ED. Workup is unremarkable. We'll discharge her home. Patient was counseled on massage for the muscle spasm, will continue NSAIDs, will continue heating pad. Follow-up with primary care. Medical stable  ____________________________________________   FINAL CLINICAL IMPRESSION(S) / ED DIAGNOSES  Final diagnoses:  None   right subscapularis spasm causing musculoskeletal pain Syncope related to vagal episode and dehydration.    Sharman CheekPhillip Vallorie Niccoli, MD 05/21/14 2351

## 2014-05-23 ENCOUNTER — Encounter: Payer: Self-pay | Admitting: Internal Medicine

## 2014-05-23 ENCOUNTER — Ambulatory Visit (INDEPENDENT_AMBULATORY_CARE_PROVIDER_SITE_OTHER): Payer: BC Managed Care – PPO | Admitting: Internal Medicine

## 2014-05-23 VITALS — BP 110/60 | HR 64 | Temp 97.8°F | Wt 150.0 lb

## 2014-05-23 DIAGNOSIS — M546 Pain in thoracic spine: Secondary | ICD-10-CM | POA: Insufficient documentation

## 2014-05-23 DIAGNOSIS — R55 Syncope and collapse: Secondary | ICD-10-CM | POA: Diagnosis not present

## 2014-05-23 NOTE — Progress Notes (Signed)
   Subjective:    Patient ID: Charlotte Wright, female    DOB: 10/21/1950, 64 y.o.   MRN: [id number]  HPI Here for ER follow up  Ongoing back pain Started when she was lifting chairs over her body while sitting on riding mower Right side back pain by scapula since then Using heat, massage, ibuprofen 200mg  at a time  Busy day 2 days ago [place] Increasing pain [place] Hadn't eaten much Then running around with 63 year old grandson Started to feel the pain much worse Went to sit down and husband went to get car  Felt dizzy and nauseated Next thing she knew she was on ground  Pain in back was better then but was sweaty and thirsty  Resting today Still using heat Increased ibuprofen to 400mg  per dose  Eating and drinking okay today  No current outpatient prescriptions on file prior to visit.   No current facility-administered medications on file prior to visit.    Allergies  Allergen Reactions  . Penicillins     REACTION: rash  . Sulfa Antibiotics Rash    Past Medical History  Diagnosis Date  . Anxiety   . Hyperlipidemia   . Ovarian cyst 1970's    right  . ASCUS favor benign 8/13    Past Surgical History  Procedure Laterality Date  . Vaginal delivery      x5  . Tubal ligation    . Breast lumpectomy  1986    right  . Breast biopsy  6/13    negative    Family History  Problem Relation Age of Onset  . Multiple sclerosis Mother   . Asthma Brother     History   Social History  . Marital Status: Married    Spouse Name: N/A  . Number of Children: 5  . Years of Education: N/A   Occupational History  . Retail buyernglish teacher --retired    Social History Main Topics  . Smoking status: Never Smoker   . Smokeless tobacco: Never Used  . 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 Drug Use: No  . Sexual Activity: Not on file   Other Topics  Concern  . 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      Finished Master's in [place]   Review of Systems No chest pain No palpitations Hasn't felt sick    Objective:   Physical Exam  Constitutional: She appears well-developed and well-nourished. No distress.  Neck: Normal range of motion. Neck supple. No thyromegaly present.  Cardiovascular: Normal rate, regular rhythm, normal heart sounds and intact distal pulses.  Exam reveals no gallop.   No murmur heard. Pulmonary/Chest: Effort normal and breath sounds normal. No respiratory distress. She has no wheezes. She has no rales.  Abdominal: Soft. There is no tenderness.  Musculoskeletal: She exhibits no edema or tenderness.  Pain along upper medial right scapular border but normal ROM of left arm and strength  Lymphadenopathy:    She has no cervical adenopathy.  Psychiatric: She has a normal mood and affect. Her behavior is normal.          Assessment & Plan:

## 2014-05-23 NOTE — Assessment & Plan Note (Signed)
Clearly seems to be muscular Continue heat and ibuprofen

## 2014-05-23 NOTE — Assessment & Plan Note (Signed)
Had severe pain May have had mild hypoglycemic spell with busy day and not eating much Reviewed ER records--- EKG basically normal Labs fine  Reassured--not a serious thing Discussed getting supine if ever has that feeling again

## 2014-05-23 NOTE — Progress Notes (Signed)
Pre visit review using our clinic review tool, if applicable. No additional management support is needed unless otherwise documented below in the visit note. 

## 2014-05-23 NOTE — Patient Instructions (Signed)
You can continue ibuprofen 400mg  (2 of the over the counter tabs) three or four times a day---till your back is better.

## 2014-06-20 ENCOUNTER — Encounter: Payer: Self-pay | Admitting: Internal Medicine

## 2014-06-30 ENCOUNTER — Other Ambulatory Visit: Payer: Self-pay | Admitting: Radiology

## 2014-07-04 ENCOUNTER — Encounter: Payer: Self-pay | Admitting: Internal Medicine

## 2014-07-13 ENCOUNTER — Telehealth: Payer: Self-pay | Admitting: *Deleted

## 2014-07-13 NOTE — Telephone Encounter (Signed)
Left message on home VM asking pt to return my call Left message on cell VM with results and asked pt to return my call if she had an questions   Biopsy results, done on 06/30/14 per Dr. Alphonsus SiasLetvak all looked fine, they want to repeat her mammogram in 6mths. (copy will be scanned in)

## 2014-07-13 NOTE — Telephone Encounter (Signed)
Pt left v/m returning call; no contact # left.

## 2014-07-13 NOTE — Telephone Encounter (Signed)
Spoke with patient and advised results   

## 2014-07-21 ENCOUNTER — Encounter: Payer: Self-pay | Admitting: Internal Medicine

## 2014-09-20 ENCOUNTER — Ambulatory Visit (INDEPENDENT_AMBULATORY_CARE_PROVIDER_SITE_OTHER): Payer: BC Managed Care – PPO | Admitting: Internal Medicine

## 2014-09-20 ENCOUNTER — Encounter: Payer: Self-pay | Admitting: Internal Medicine

## 2014-09-20 VITALS — BP 120/70 | HR 61 | Temp 97.9°F | Ht 66.0 in | Wt 149.0 lb

## 2014-09-20 DIAGNOSIS — Z0000 Encounter for general adult medical examination without abnormal findings: Secondary | ICD-10-CM | POA: Diagnosis not present

## 2014-09-20 DIAGNOSIS — E785 Hyperlipidemia, unspecified: Secondary | ICD-10-CM | POA: Diagnosis not present

## 2014-09-20 DIAGNOSIS — Z23 Encounter for immunization: Secondary | ICD-10-CM

## 2014-09-20 NOTE — Assessment & Plan Note (Signed)
Healthy Due for pap next year Advised waiting 2 years for next mammogram Colon due 2019 Flu vaccine today

## 2014-09-20 NOTE — Assessment & Plan Note (Signed)
Eats a healthy diet Prefers not to take statin for primary prevention FH not particularly striking

## 2014-09-20 NOTE — Progress Notes (Signed)
Subjective:    Patient ID: Charlotte Wright, female    DOB: [birth year], 64 y.o.   MRN: [id number]  HPI Here for physical  No new concerns Thinks she broke left 5th toe in May--better now Occasional left hip pain--?early arthritis Still walks regularly Some back soreness if up for an extended time-- better with rest. Discussed core strengthening. Still watches grandchildren-- 3 days per week  Had recent biopsy--2nd one on the right side Discussed that these are false alarms--probably should go to every other year.  Actually had some surgery in the 1990's  No current outpatient prescriptions on file prior to visit.   No current facility-administered medications on file prior to visit.    Allergies  Allergen Reactions  . Penicillins     REACTION: rash  . Sulfa Antibiotics Rash    Past Medical History  Diagnosis Date  . Anxiety   . Hyperlipidemia   . Ovarian cyst 1970's    right  . ASCUS favor benign 8/13    Past Surgical History  Procedure Laterality Date  . Vaginal delivery      x5  . Tubal ligation    . Breast lumpectomy  1986    right  . Breast biopsy  6/13    negative    Family History  Problem Relation Age of Onset  . Multiple sclerosis Mother   . Asthma Brother     Social History   Social History  . Marital Status: Married    Spouse Name: N/A  . Number of Children: 5  . Years of Education: N/A   Occupational History  . Retail buyer --retired    Social History Main Topics  . Smoking status: Never Smoker   . Smokeless tobacco: Never Used  . 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 Drug Use: No  . Sexual Activity: Not on file   Other Topics Concern  . 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      Finished Master's in [place]   Review of Systems  Constitutional: Negative for fatigue and unexpected weight change.       Wears seat belt    HENT: Negative for dental problem, hearing loss and tinnitus.        Regular with dentist  Eyes: Negative for visual disturbance.       No diplopia or unilateral vision loss   Respiratory: Negative for cough, chest tightness and shortness of breath.   Cardiovascular: Negative for chest pain, palpitations and leg swelling.  Gastrointestinal: Negative for nausea, vomiting, abdominal pain, constipation and blood in stool.  Endocrine: Negative for polydipsia and polyuria.  Genitourinary: Negative for dysuria, hematuria and dyspareunia.  Musculoskeletal: Positive for back pain. Negative for joint swelling and arthralgias.  Skin: Positive for rash.       Very sensitive to poison ivy--gets generalized rash (but not severe) Same hyperpigmented leison on right cheek--and nearby rough spot. No recent change  Allergic/Immunologic: Negative for environmental allergies and immunocompromised state.  Neurological: Negative for dizziness, syncope, weakness, light-headedness and headaches.  Hematological: Negative for adenopathy. Bruises/bleeds easily.  Psychiatric/Behavioral: Negative for sleep disturbance and dysphoric mood. The patient is not nervous/anxious.        Objective:   Physical Exam  Constitutional: She is oriented to person, place, and time. She appears well-developed and well-nourished. No distress.  HENT:  Head: Normocephalic and atraumatic.  Right Ear: External ear normal.  Left Ear: External ear normal.  Mouth/Throat: Oropharynx is clear and moist. No oropharyngeal exudate.  Eyes: Conjunctivae and EOM are normal. Pupils are equal, round, and reactive to light.  Neck: Normal range of motion. Neck supple. No thyromegaly present.  Cardiovascular: Normal rate, regular rhythm, normal heart sounds and intact distal pulses.  Exam reveals no gallop.   No murmur heard. Pulmonary/Chest: Effort normal and breath sounds normal. No respiratory distress. She has no wheezes. She has no rales.   Abdominal: Soft. There is no tenderness.  Musculoskeletal: She exhibits no edema or tenderness.  Lymphadenopathy:    She has no cervical adenopathy.  Neurological: She is alert and oriented to person, place, and time.  Skin: No rash noted. No erythema.  Psychiatric: She has a normal mood and affect. Her behavior is normal.          Assessment & Plan:

## 2014-09-20 NOTE — Progress Notes (Signed)
Pre visit review using our clinic review tool, if applicable. No additional management support is needed unless otherwise documented below in the visit note. 

## 2014-09-20 NOTE — Addendum Note (Signed)
Addended by: Sueanne Margarita on: 09/20/2014 02:20 PM   Modules accepted: Orders

## 2015-09-22 ENCOUNTER — Other Ambulatory Visit (HOSPITAL_COMMUNITY)
Admission: RE | Admit: 2015-09-22 | Discharge: 2015-09-22 | Disposition: A | Discharge status: Home / Self Care | Payer: BC Managed Care – PPO | Source: Ambulatory Visit | Attending: Internal Medicine | Admitting: Internal Medicine

## 2015-09-22 ENCOUNTER — Ambulatory Visit (INDEPENDENT_AMBULATORY_CARE_PROVIDER_SITE_OTHER): Payer: BC Managed Care – PPO | Admitting: Internal Medicine

## 2015-09-22 ENCOUNTER — Encounter: Payer: Self-pay | Admitting: Internal Medicine

## 2015-09-22 VITALS — BP 110/72 | HR 68 | Temp 98.4°F | Ht 66.0 in | Wt 156.5 lb

## 2015-09-22 DIAGNOSIS — E785 Hyperlipidemia, unspecified: Secondary | ICD-10-CM

## 2015-09-22 DIAGNOSIS — Z23 Encounter for immunization: Secondary | ICD-10-CM

## 2015-09-22 DIAGNOSIS — Z01419 Encounter for gynecological examination (general) (routine) without abnormal findings: Secondary | ICD-10-CM | POA: Diagnosis present

## 2015-09-22 DIAGNOSIS — Z0000 Encounter for general adult medical examination without abnormal findings: Secondary | ICD-10-CM

## 2015-09-22 DIAGNOSIS — Z1151 Encounter for screening for human papillomavirus (HPV): Secondary | ICD-10-CM | POA: Diagnosis not present

## 2015-09-22 LAB — COMPREHENSIVE METABOLIC PANEL
ALT: 15 U/L (ref 0–35)
AST: 21 U/L (ref 0–37)
Albumin: 4.6 g/dL (ref 3.5–5.2)
Alkaline Phosphatase: 42 U/L (ref 39–117)
BUN: 18 mg/dL (ref 6–23)
CALCIUM: 9.4 mg/dL (ref 8.4–10.5)
CHLORIDE: 104 meq/L (ref 96–112)
CO2: 29 mEq/L (ref 19–32)
Creatinine, Ser: 0.79 mg/dL (ref 0.40–1.20)
GFR: 77.79 mL/min (ref >60.00)
Glucose, Bld: 76 mg/dL (ref 70–99)
POTASSIUM: 4 meq/L (ref 3.5–5.1)
Sodium: 140 mEq/L (ref 135–145)
Total Bilirubin: 0.6 mg/dL (ref 0.2–1.2)
Total Protein: 7.3 g/dL (ref 6.0–8.3)

## 2015-09-22 LAB — CBC WITH DIFFERENTIAL/PLATELET
BASOS PCT: 0.6 % (ref 0.0–3.0)
Basophils Absolute: 0 10*3/uL (ref 0.0–0.1)
EOS ABS: 0.1 10*3/uL (ref 0.0–0.7)
EOS PCT: 0.7 % (ref 0.0–5.0)
HEMATOCRIT: 38 % (ref 36.0–46.0)
HEMOGLOBIN: 12.8 g/dL (ref 12.0–15.0)
LYMPHS PCT: 26.9 % (ref 12.0–46.0)
Lymphs Abs: 2 10*3/uL (ref 0.7–4.0)
MCHC: 33.7 g/dL (ref 30.0–36.0)
MCV: 96.5 fl (ref 78.0–100.0)
Monocytes Absolute: 0.5 10*3/uL (ref 0.1–1.0)
Monocytes Relative: 7.2 % (ref 3.0–12.0)
Neutro Abs: 4.8 10*3/uL (ref 1.4–7.7)
Neutrophils Relative %: 64.6 % (ref 43.0–77.0)
Platelets: 313 10*3/uL (ref 150.0–400.0)
RBC: 3.94 Mil/uL (ref 3.87–5.11)
RDW: 13.1 % (ref 11.5–15.5)
WBC: 7.4 10*3/uL (ref 4.0–10.5)

## 2015-09-22 LAB — LIPID PANEL
CHOLESTEROL: 255 mg/dL — AB (ref 0–200)
HDL: 74.5 mg/dL (ref >39.00)
LDL CALC: 159 mg/dL — AB (ref 0–99)
NonHDL: 180.42
Total CHOL/HDL Ratio: 3
Triglycerides: 107 mg/dL (ref 0.0–149.0)
VLDL: 21.4 mg/dL (ref 0.0–40.0)

## 2015-09-22 LAB — T4, FREE: Free T4: 0.73 ng/dL (ref 0.60–1.60)

## 2015-09-22 NOTE — Assessment & Plan Note (Signed)
Good HDL and low risk Prefers no med

## 2015-09-22 NOTE — Assessment & Plan Note (Signed)
Healthy Flu vaccine today Colon due 2019 mammo due 6/18 Reviewed fitness Last pap done today

## 2015-09-22 NOTE — Progress Notes (Signed)
Pre visit review using our clinic review tool, if applicable. No additional management support is needed unless otherwise documented below in the visit note. 

## 2015-09-22 NOTE — Addendum Note (Signed)
Addended by: Eual FinesBRIDGES, Kayann Maj P on: 09/22/2015 03:41 PM   Modules accepted: Orders
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
## 2015-09-22 NOTE — Addendum Note (Signed)
Addended by: Eual FinesBRIDGES, Analeah Brame P on: 09/22/2015 12:38 PM   Modules accepted: Orders

## 2015-09-26 LAB — CYTOLOGY - PAP

## 2016-01-29 ENCOUNTER — Telehealth: Payer: Self-pay

## 2016-01-29 MED ORDER — OSELTAMIVIR PHOSPHATE 75 MG PO CAPS: 75.0000 mg | ORAL_CAPSULE | Freq: Once | ORAL | 0 refills | Status: AC

## 2016-01-29 NOTE — Telephone Encounter (Signed)
Sent Rx to pharmacy. LB 

## 2016-09-27 ENCOUNTER — Encounter: Payer: Self-pay | Admitting: Internal Medicine

## 2016-09-27 ENCOUNTER — Ambulatory Visit (INDEPENDENT_AMBULATORY_CARE_PROVIDER_SITE_OTHER): Payer: Medicare Other | Admitting: Internal Medicine

## 2016-09-27 VITALS — BP 116/76 | HR 70 | Temp 98.3°F | Ht 66.0 in | Wt 161.0 lb

## 2016-09-27 DIAGNOSIS — Z23 Encounter for immunization: Secondary | ICD-10-CM

## 2016-09-27 DIAGNOSIS — E785 Hyperlipidemia, unspecified: Secondary | ICD-10-CM | POA: Diagnosis not present

## 2016-09-27 DIAGNOSIS — Z0000 Encounter for general adult medical examination without abnormal findings: Secondary | ICD-10-CM | POA: Insufficient documentation

## 2016-09-27 DIAGNOSIS — E2839 Other primary ovarian failure: Secondary | ICD-10-CM

## 2016-09-27 DIAGNOSIS — Z7189 Other specified counseling: Secondary | ICD-10-CM | POA: Diagnosis not present
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
## 2016-09-27 NOTE — Assessment & Plan Note (Signed)
Fairly low risk with high HDL She doesn't want Rx Will defer labs this year

## 2016-09-27 NOTE — Assessment & Plan Note (Signed)
See social history 

## 2016-09-27 NOTE — Patient Instructions (Signed)
Please set up your screening mammogram. 

## 2016-10-04 ENCOUNTER — Encounter: Payer: Self-pay | Admitting: Internal Medicine

## 2016-10-10 ENCOUNTER — Telehealth: Payer: Self-pay

## 2016-10-10 NOTE — Telephone Encounter (Signed)
Sent My-Chart message

## 2016-10-31 ENCOUNTER — Encounter: Payer: Self-pay | Admitting: Internal Medicine

## 2016-10-31 DIAGNOSIS — M810 Age-related osteoporosis without current pathological fracture: Secondary | ICD-10-CM | POA: Insufficient documentation

## 2017-08-01 ENCOUNTER — Ambulatory Visit: Payer: Medicare Other | Admitting: Internal Medicine

## 2017-09-30 ENCOUNTER — Encounter: Payer: BC Managed Care – PPO | Admitting: Internal Medicine

## 2017-12-19 ENCOUNTER — Encounter: Payer: Self-pay | Admitting: Internal Medicine

## 2017-12-19 ENCOUNTER — Ambulatory Visit: Payer: Medicare Other | Admitting: Internal Medicine

## 2017-12-19 VITALS — BP 120/74 | HR 71 | Temp 98.6°F | Wt 169.0 lb

## 2017-12-19 DIAGNOSIS — M25561 Pain in right knee: Secondary | ICD-10-CM | POA: Diagnosis not present

## 2017-12-19 MED ORDER — NAPROXEN 500 MG PO TABS: 500.0000 mg | ORAL_TABLET | Freq: Two times a day (BID) | ORAL | 0 refills | Status: DC
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
## 2017-12-19 NOTE — Patient Instructions (Signed)
Meniscus Tear A meniscus tear is a knee injury in which a piece of the meniscus is torn. The meniscus is a thick, rubbery, wedge-shaped cartilage in the knee. Two menisci are located in each knee. They sit between the upper bone (femur) and lower bone (tibia) that make up the knee joint. Each meniscus acts as a shock absorber for the knee. A torn meniscus is one of the most common types of knee injuries. This injury can range from mild to severe. Surgery may be needed for a severe tear. What are the causes? This injury may be caused by any squatting, twisting, or pivoting movement. Sports-related injuries are the most common cause. These often occur from:  Running and stopping suddenly.  Changing direction.  Being tackled or knocked off your feet.  As people get older, their meniscus gets thinner and weaker. In these people, tears can happen more easily, such as from climbing stairs. What increases the risk? This injury is more likely to happen to:  People who play contact sports.  Males.  People who are 30?66 years of age.  What are the signs or symptoms? Symptoms of this injury include:  Knee pain, especially at the side of the knee joint. You may feel pain when the injury occurs, or you may only hear a pop and feel pain later.  A feeling that your knee is clicking, catching, locking, or giving way.  Not being able to fully bend or extend your knee.  Bruising or swelling in your knee.  How is this diagnosed? This injury may be diagnosed based on your symptoms and a physical exam. The physical exam may include:  Moving your knee in different ways.  Feeling for tenderness.  Listening for a clicking sound.  Checking if your knee locks or catches.  You may also have tests, such as:  X-rays.  MRI.  A procedure to look inside your knee with a narrow surgical telescope (arthroscopy).  You may be referred to a knee specialist (orthopedic surgeon). How is this  treated? Treatment for this injury depends on the severity of the tear. Treatment for a mild tear may include:  Rest.  Medicine to reduce pain and swelling. This is usually a nonsteroidal anti-inflammatory drug (NSAID).  A knee brace or an elastic sleeve or wrap.  Using crutches or a walker to keep weight off your knee and to help you walk.  Exercises to strengthen your knee (physical therapy).  You may need surgery if you have a severe tear or if other treatments are not working. Follow these instructions at home: Managing pain and swelling  Take over-the-counter and prescription medicines only as told by your health care provider.  If directed, apply ice to the injured area: ? Put ice in a plastic bag. ? Place a towel between your skin and the bag. ? Leave the ice on for 20 minutes, 2-3 times per day.  Raise (elevate) the injured area above the level of your heart while you are sitting or lying down. Activity  Do not use the injured limb to support your body weight until your health care provider says that you can. Use crutches or a walker as told by your health care provider.  Return to your normal activities as told by your health care provider. Ask your health care provider what activities are safe for you.  Perform range-of-motion exercises only as told by your health care provider.  Begin doing exercises to strengthen your knee and leg muscles only   as told by your health care provider. After you recover, your health care provider may recommend these exercises to help prevent another injury. General instructions  Use a knee brace or elastic wrap as told by your health care provider.  Keep all follow-up visits as told by your health care provider. This is important. Contact a health care provider if:  You have a fever.  Your knee becomes red, tender, or swollen.  Your pain medicine is not helping.  Your symptoms get worse or do not improve after 2 weeks of home  care. This information is not intended to replace advice given to you by your health care provider. Make sure you discuss any questions you have with your health care provider. Document Released: 03/23/2002 Document Revised: 06/08/2015 Document Reviewed: 04/25/2014 Elsevier Interactive Patient Education  2018 [employer]  

## 2018-01-15 ENCOUNTER — Encounter: Payer: Medicare Other | Admitting: Internal Medicine

## 2018-01-18 ENCOUNTER — Encounter: Payer: Self-pay | Admitting: Gastroenterology

## 2018-01-27 ENCOUNTER — Encounter: Payer: Self-pay | Admitting: Gastroenterology

## 2018-02-13 ENCOUNTER — Telehealth: Payer: Self-pay | Admitting: Internal Medicine

## 2018-02-13 NOTE — Telephone Encounter (Signed)
error 

## 2018-02-27 ENCOUNTER — Encounter: Payer: Medicare Other | Admitting: Gastroenterology

## 2018-04-30 ENCOUNTER — Encounter: Payer: Medicare Other | Admitting: Internal Medicine

## 2018-05-15 ENCOUNTER — Telehealth: Payer: Self-pay | Admitting: Internal Medicine

## 2018-05-15 ENCOUNTER — Ambulatory Visit (INDEPENDENT_AMBULATORY_CARE_PROVIDER_SITE_OTHER): Payer: Medicare Other | Admitting: Internal Medicine

## 2018-05-15 ENCOUNTER — Encounter: Payer: Self-pay | Admitting: Internal Medicine

## 2018-05-15 VITALS — Temp 98.1°F | Ht 67.0 in | Wt 159.0 lb

## 2018-05-15 DIAGNOSIS — Z1211 Encounter for screening for malignant neoplasm of colon: Secondary | ICD-10-CM | POA: Diagnosis not present

## 2018-05-15 DIAGNOSIS — Z7189 Other specified counseling: Secondary | ICD-10-CM

## 2018-05-15 DIAGNOSIS — Z0000 Encounter for general adult medical examination without abnormal findings: Secondary | ICD-10-CM | POA: Diagnosis not present

## 2018-05-15 DIAGNOSIS — E785 Hyperlipidemia, unspecified: Secondary | ICD-10-CM

## 2018-05-15 DIAGNOSIS — M810 Age-related osteoporosis without current pathological fracture: Secondary | ICD-10-CM

## 2018-05-15 NOTE — Progress Notes (Signed)
Subjective:    Patient ID: Charlotte Wright, female    DOB: 1950-10-02, 68 y.o.   MRN: [id number]  HPI Virtual visit for annual wellness visit and follow up of chronic health conditions Identification done Reviewed billing and she gave consent She is in her home and I am at my office  She is doing well in general Does a lot of gardening and yard work [employer] every day (and the [facility] for [employer] before it closed)  Did twist her knee back in December Seen here---?meniscus injury Feels better now unless she over extends the knee  Will get some aching in her shoulders--especially after yard work Always present in mild form but doesn't limit her  Reviewed her DEXA Does show mild osteoporosis in the hip (I had misread it) She gets calcium in diet Discussed adding vitamin  Discussed the high cholesterol Father probably died of stroke-- at 48 No other vascular history in the family Active and very healthy diet  Reviewed advanced directives Dr Rosaria Ferries, Dr Carloyn Jaeger doctor are only other doctors No tobacco No alcohol No falls No depression or anhedonia Vision is fine. Hearing is fine. Doesn't check BP --unable to check it at virtual visit Independent with instrumental ADLs  No current outpatient medications on file prior to visit.   No current facility-administered medications on file prior to visit.     Allergies  Allergen Reactions  . Penicillins     REACTION: rash  . Sulfa Antibiotics Rash    Past Medical History:  Diagnosis Date  . Anxiety   . ASCUS favor benign 8/13  . Hyperlipidemia   . Osteoporosis   . Ovarian cyst 1970's   right    Past Surgical History:  Procedure Laterality Date  . BREAST BIOPSY  6/13   negative  . BREAST LUMPECTOMY  1986   right  . TUBAL LIGATION    . VAGINAL DELIVERY     x5    Family History  Problem Relation Age of Onset  . Multiple sclerosis Mother   . Arthritis Brother        multiple back surgeries  . Asthma  Brother   . Heart disease Maternal Aunt     Social History   Socioeconomic History  . Marital status: Married    Spouse name: Not on file  . Number of children: 5  . Years of education: Not on file  . Highest education level: Not on file  Occupational History  . Occupation: Retail buyer --retired  Engineer, production  . Financial resource strain: Not on file  . Food insecurity:    Worry: Not on file    Inability: Not on file  . Transportation needs:    Medical: Not on file    Non-medical: Not on file  Tobacco Use  . Smoking status: Never Smoker  . Smokeless tobacco: Never Used  Substance and Sexual Activity  . 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 Drug use: No  . Sexual activity: Not on file  Lifestyle  . Physical activity:    Days per week: Not on file    Minutes per session: Not on file  . Stress: Not on file  Relationships  . Social connections:    Talks on phone: Not on file    Gets together: Not on file    Attends religious service: Not on file    Active member of club or organization: Not on file  Attends meetings of clubs or organizations: Not on file    Relationship status: Not on file  . Intimate partner violence:    Fear of current or ex partner: Not on file    Emotionally abused: Not on file    Physically abused: Not on file    Forced sexual activity: Not on file  Other Topics Concern  . 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   Finished Master's in [place]      Has living will   Husband, then daughter Dahlia ClientHannah, would be health care POA   Would accept resuscitation but no prolonged ventilation   No tube feeds if cognitively unaware   Review of Systems Appetite is good Weight is down slightly---by her efforts Son/partner and 104 month old baby now living with them Sleeps well Wears seat belt No chest pain or palpitations No SOB No dizziness or  syncope No heartburn or dysphagia Bowels are regular--no blood No rash or skin ulcers    Objective:   Physical Exam  Constitutional: She appears well-developed. No distress.  Respiratory: Effort normal. No respiratory distress.  Neurological:  President--- "Garnet Koyanagionald Trump, Obama, Bush" [phone] Recall 3/3  Skin: No rash noted.  Psychiatric: She has a normal mood and affect. Her behavior is normal.           Assessment & Plan:

## 2018-05-15 NOTE — Assessment & Plan Note (Signed)
I have personally reviewed the Medicare Annual Wellness questionnaire and have noted 1. The patient's medical and social history 2. Their use of alcohol, tobacco or illicit drugs 3. Their current medications and supplements 4. The patient's functional ability including ADL's, fall risks, home safety risks and hearing or visual             impairment. 5. Diet and physical activities 6. Evidence for depression or mood disorders  The patients weight, height, BMI and visual acuity have been recorded in the chart I have made referrals, counseling and provided education to the patient based review of the above and I have provided the pt with a written personalized care plan for preventive services.  I have provided you with a copy of your personalized plan for preventive services. Please take the time to review along with your updated medication list.  Will try to give prevnar as nurse visit--and shingrix Pneumovax next year Colon overdue---will put in consult Due for mammogram in the fall--she will set it up Has healthy lifestyle Yearly flu vaccine

## 2018-05-15 NOTE — Patient Instructions (Signed)
Please start vitamin D---1000 units daily. Someone will call about the colonoscopy. Please set up your screening mammogram in September or October

## 2018-05-15 NOTE — Telephone Encounter (Signed)
Please put patient on wait list for Shingrix.

## 2018-05-15 NOTE — Assessment & Plan Note (Signed)
See social history 

## 2018-05-15 NOTE — Assessment & Plan Note (Signed)
Has borderline osteoporosis in hip Has dietary calcium Asked her to add vitamin D (1000 units daily) Regular weight bearing physical exercise Will plan to recheck in 2-3 years and start bisphosphonate then if worsened

## 2018-05-15 NOTE — Assessment & Plan Note (Signed)
Low risk with high HDL and no other sig risks She still prefers no meds Will recheck next year

## 2018-05-21 ENCOUNTER — Other Ambulatory Visit: Payer: Self-pay

## 2018-05-21 ENCOUNTER — Ambulatory Visit (INDEPENDENT_AMBULATORY_CARE_PROVIDER_SITE_OTHER): Payer: Medicare Other

## 2018-05-21 DIAGNOSIS — Z23 Encounter for immunization: Secondary | ICD-10-CM | POA: Diagnosis not present

## 2018-05-21 NOTE — Progress Notes (Signed)
Per orders of Dr. Alphonsus Sias (co signed by Nicki Reaper, NP in Dr. Karle Starch absence), injection of Prevnar13 given by Consuella Lose.  Patient tolerated injection well.

## 2018-07-08 NOTE — Telephone Encounter (Signed)
Noted, however given patient's age, she will need to check with her insurance first to be sure they will cover under her in office benefits and not at the pharmacy.  I will add her to the list but Charlotte Wright, if you could call her and let her know about her insurance I would appreciate it as I will be out of the office for the next week and a half.  Thanks!

## 2018-07-09 NOTE — Telephone Encounter (Signed)
I spoke to patient and she'll call Medicare.  I let her know she was added to the list, so if she has to go to the pharmacy to let us know to take her off the wait list.

## 2018-08-26 ENCOUNTER — Ambulatory Visit: Payer: Medicare Other

## 2018-08-26 ENCOUNTER — Telehealth: Payer: Self-pay | Admitting: Internal Medicine

## 2018-08-26 NOTE — Telephone Encounter (Signed)
Pt put in following request via [place]. Please advise on if patient is due for hep c. Anastasiya called patient yesterday to let her know she is not due for second pneumonia until May 2021. See request below:  Appointment Request From: Bertis Ruddy    With Provider: Viviana Simpler, MD [facility]    Preferred Date Range: From 08/26/2018 To 10/30/2018    Preferred Times: Any    Reason: To address the following health maintenance concerns.  Hepatitis C Screening  Influenza Vaccine    Comments:  I need my 2nd dose of the Pneumonia vaccine as well. Appt. was cancelled this morning by [facility]

## 2018-08-27 NOTE — Telephone Encounter (Signed)
Pt returned Shannon's call. I relayed the message to her and she verbalized understanding.

## 2018-08-27 NOTE — Telephone Encounter (Signed)
Tried to call pt and VM was full. Placed this info in the [place] she sent originally.

## 2018-08-27 NOTE — Telephone Encounter (Signed)
We can do the hep c screening with her blood work next year (have her remind me)

## 2018-09-08 ENCOUNTER — Telehealth: Payer: Self-pay | Admitting: Internal Medicine

## 2018-09-08 NOTE — Telephone Encounter (Signed)
Patient called to schedule flu vaccine and pneumonia vaccine.  Can you look in her chart to see when she is due for the pneumonia

## 2018-09-08 NOTE — Telephone Encounter (Signed)
I could not reach patient and her VM was full unable to leave a message.   Patient last had her Prevnar 13 vaccine in May 2020.  She has to wait 1 year before she can receive the 2nd required pneumonia vaccine (the pneumovax 23).  They have to be 1 year a part.   Please call patient and let her know that she can get her flu shot but is too early for her pneumovax 23.  She will have to get that next year.   Also, please be sure we have her scheduled for her flu shot.   Thanks!

## 2018-09-09 NOTE — Telephone Encounter (Signed)
Tried to contact patient again. Was not able to leave a message

## 2018-09-16 ENCOUNTER — Ambulatory Visit: Payer: Medicare Other

## 2018-09-18 ENCOUNTER — Telehealth: Payer: Self-pay | Admitting: Internal Medicine

## 2018-09-18 ENCOUNTER — Encounter: Payer: Self-pay | Admitting: Gastroenterology

## 2018-09-18 NOTE — Telephone Encounter (Signed)
Pt sent a message via MyChart asking about when she is due for her mammogram and her 2nd shingrix vaccination. Please advise.

## 2018-09-18 NOTE — Telephone Encounter (Signed)
Spoke to pt. She will call [facility] to set up her colonoscopy. I advised her to make her mammogram appointment towards the end of the month.

## 2018-10-02 LAB — HM MAMMOGRAPHY

## 2018-10-13 ENCOUNTER — Ambulatory Visit (AMBULATORY_SURGERY_CENTER): Payer: Self-pay | Admitting: *Deleted

## 2018-10-13 ENCOUNTER — Other Ambulatory Visit: Payer: Self-pay

## 2018-10-13 ENCOUNTER — Encounter: Payer: Self-pay | Admitting: Gastroenterology

## 2018-10-13 VITALS — Temp 97.3°F | Ht 67.0 in | Wt 167.4 lb

## 2018-10-13 DIAGNOSIS — Z1211 Encounter for screening for malignant neoplasm of colon: Secondary | ICD-10-CM

## 2018-10-13 MED ORDER — PEG 3350-KCL-NA BICARB-NACL 420 G PO SOLR: 4000.0000 mL | Freq: Once | ORAL | 0 refills | Status: AC

## 2018-10-13 NOTE — Progress Notes (Signed)

## 2018-10-27 ENCOUNTER — Encounter: Payer: Medicare Other | Admitting: Gastroenterology

## 2018-11-03 ENCOUNTER — Encounter: Payer: Self-pay | Admitting: Internal Medicine

## 2018-11-18 ENCOUNTER — Telehealth: Payer: Self-pay

## 2018-11-18 NOTE — Telephone Encounter (Signed)
Covid-19 screening questions   Do you now or have you had a fever in the last 14 days?  Do you have any respiratory symptoms of shortness of breath or cough now or in the last 14 days?  Do you have any family members or close contacts with diagnosed or suspected Covid-19 in the past 14 days?  Have you been tested for Covid-19 and found to be positive?       

## 2018-11-18 NOTE — Telephone Encounter (Signed)
Pt called back to answer Covid-19 screening questions.  She answered no to all of them.

## 2018-11-19 ENCOUNTER — Other Ambulatory Visit: Payer: Self-pay

## 2018-11-19 ENCOUNTER — Ambulatory Visit (AMBULATORY_SURGERY_CENTER): Payer: Medicare Other | Admitting: Gastroenterology

## 2018-11-19 ENCOUNTER — Encounter: Payer: Self-pay | Admitting: Gastroenterology

## 2018-11-19 VITALS — BP 110/66 | HR 58 | Temp 97.9°F | Resp 12 | Ht 67.0 in | Wt 167.4 lb

## 2018-11-19 DIAGNOSIS — Z1211 Encounter for screening for malignant neoplasm of colon: Secondary | ICD-10-CM

## 2018-11-19 MED ORDER — SODIUM CHLORIDE 0.9 % IV SOLN: 500.0000 mL | INTRAVENOUS | Status: DC

## 2018-11-19 NOTE — Progress Notes (Signed)
A/ox3, pleased with MAC, report to RN 

## 2018-11-19 NOTE — Progress Notes (Signed)
Temperature taken by J.B., VS taken by N.C. 

## 2018-11-19 NOTE — Op Note (Signed)
[facility] Patient Name: Charlotte Wright Procedure Date: 11/19/2018 8:46 AM MRN: [id number] Endoscopist: Justice Britain , MD Age: 68 Referring MD:  Date of Birth: Apr 17, 1950 Gender: Female Account #: [id number] Procedure:                Colonoscopy Indications:              Screening for colorectal malignant neoplasm Medicines:                Monitored Anesthesia Care Procedure:                Pre-Anesthesia Assessment:                           - Prior to the procedure, a History and Physical                            was performed, and patient medications and                            allergies were reviewed. The patient's tolerance of                            previous anesthesia was also reviewed. The risks                            and benefits of the procedure and the sedation                            options and risks were discussed with the patient.                            All questions were answered, and informed consent                            was obtained. Prior Anticoagulants: The patient has                            taken no previous anticoagulant or antiplatelet                            agents. ASA Grade Assessment: II - A patient with                            mild systemic disease. After reviewing the risks                            and benefits, the patient was deemed in                            satisfactory condition to undergo the procedure.                           After obtaining informed consent, the colonoscope  was passed under direct vision. Throughout the                            procedure, the patient's blood pressure, pulse, and                            oxygen saturations were monitored continuously. The                            Colonoscope was introduced through the anus and                            advanced to the the cecum, identified by                            appendiceal orifice and  ileocecal valve. The                            colonoscopy was performed without difficulty. The                            colonoscopy was somewhat difficult due to a                            redundant colon and significant looping. Successful                            completion of the procedure was aided by changing                            the patient's position, using manual pressure,                            straightening and shortening the scope to obtain                            bowel loop reduction and using scope torsion. The                            quality of the bowel preparation was good. The                            ileocecal valve, appendiceal orifice, and rectum                            were photographed. Scope In: 8:57:17 AM Scope Out: 6:23:76 AM Scope Withdrawal Time: 0 hours 9 minutes 29 seconds  Total Procedure Duration: 0 hours 19 minutes 1 second  Findings:                 The digital rectal exam findings include                            hemorrhoids. Pertinent negatives include no  palpable rectal lesions.                           The colon (entire examined portion) revealed                            significantly excessive looping.                           Normal mucosa was found in the entire colon.                           Non-bleeding non-thrombosed internal hemorrhoids                            were found during retroflexion, during perianal                            exam and during digital exam. The hemorrhoids were                            Grade II (internal hemorrhoids that prolapse but                            reduce spontaneously). Complications:            No immediate complications. Estimated Blood Loss:     Estimated blood loss: none. Impression:               - Hemorrhoids found on digital rectal exam.                           - There was significant looping of the colon.                            - Normal mucosa in the entire examined colon.                           - Non-bleeding non-thrombosed internal hemorrhoids. Recommendation:           - The patient will be observed post-procedure,                            until all discharge criteria are met.                           - Discharge patient to home.                           - Patient has a contact number available for                            emergencies. The signs and symptoms of potential                            delayed complications were discussed with the  patient. Return to normal activities tomorrow.                            Written discharge instructions were provided to the                            patient.                           - High fiber diet.                           - Use FiberCon 1 tablet PO daily.                           - Continue present medications.                           - Repeat colonoscopy in 10 years for screening                            purposes. Consider adult colonoscope to decrease                            issues with redundancy and looping in future.                           - The findings and recommendations were discussed                            with the patient. Justice Britain, MD 11/19/2018 9:22:19 AM

## 2018-11-19 NOTE — Progress Notes (Signed)
Pt's states no medical or surgical changes since previsit or office visit. 

## 2018-11-19 NOTE — Patient Instructions (Signed)
HANDOUT GIVEN FOR HEMORRHOIDS, USE FIBERCON 1 TABLET DAILY.  YOU HAD AN ENDOSCOPIC PROCEDURE TODAY AT [facility]:   Refer to the procedure report that was given to you for any specific questions about what was found during the examination.  If the procedure report does not answer your questions, please call your gastroenterologist to clarify.  If you requested that your care partner not be given the details of your procedure findings, then the procedure report has been included in a sealed envelope for you to review at your convenience later.  YOU SHOULD EXPECT: Some feelings of bloating in the abdomen. Passage of more gas than usual.  Walking can help get rid of the air that was put into your GI tract during the procedure and reduce the bloating. If you had a lower endoscopy (such as a colonoscopy or flexible sigmoidoscopy) you may notice spotting of blood in your stool or on the toilet paper. If you underwent a bowel prep for your procedure, you may not have a normal bowel movement for a few days.  Please Note:  You might notice some irritation and congestion in your nose or some drainage.  This is from the oxygen used during your procedure.  There is no need for concern and it should clear up in a day or so.  SYMPTOMS TO REPORT IMMEDIATELY:   Following lower endoscopy (colonoscopy or flexible sigmoidoscopy):  Excessive amounts of blood in the stool  Significant tenderness or worsening of abdominal pains  Swelling of the abdomen that is new, acute  Fever of 100F or higher  For urgent or emergent issues, a gastroenterologist can be reached at any hour by calling [phone].   DIET:  We do recommend a small meal at first, but then you may proceed to your regular diet.  Drink plenty of fluids but you should avoid alcoholic beverages for 24 hours.  ACTIVITY:  You should plan to take it easy for the rest of today and you should NOT DRIVE or use heavy machinery until tomorrow  (because of the sedation medicines used during the test).    FOLLOW UP: Our staff will call the number listed on your records 48-72 hours following your procedure to check on you and address any questions or concerns that you may have regarding the information given to you following your procedure. If we do not reach you, we will leave a message.  We will attempt to reach you two times.  During this call, we will ask if you have developed any symptoms of COVID 19. If you develop any symptoms (ie: fever, flu-like symptoms, shortness of breath, cough etc.) before then, please call [phone].  If you test positive for Covid 19 in the 2 weeks post procedure, please call and report this information to [place].    If any biopsies were taken you will be contacted by phone or by letter within the next 1-3 weeks.  Please call us at [phone] if you have not heard about the biopsies in 3 weeks.    SIGNATURES/CONFIDENTIALITY: You and/or your care partner have signed paperwork which will be entered into your electronic medical record.  These signatures attest to the fact that that the information above on your After Visit Summary has been reviewed and is understood.  Full responsibility of the confidentiality of this discharge information lies with you and/or your care-partner.

## 2018-11-23 ENCOUNTER — Telehealth: Payer: Self-pay

## 2018-11-23 NOTE — Telephone Encounter (Signed)
  Follow up Call-  Call back number 11/19/2018  Post procedure Call Back phone  # [phone]  Permission to leave phone message Yes  Some recent data might be hidden     Patient questions:  Do you have a fever, pain , or abdominal swelling? No. Pain Score  0 *  Have you tolerated food without any problems? Yes.    Have you been able to return to your normal activities? Yes.    Do you have any questions about your discharge instructions: Diet   No. Medications  No. Follow up visit  No.  Do you have questions or concerns about your Care? No.  Actions: * If pain score is 4 or above: No action needed, pain <4.  1. Have you developed a fever since your procedure? No  2.   Have you had an respiratory symptoms (SOB or cough) since your procedure? no  3.   Have you tested positive for COVID 19 since your procedure no  4.   Have you had any family members/close contacts diagnosed with the COVID 19 since your procedure?  no   If yes to any of these questions please route to Joylene John, RN and Alphonsa Gin, Therapist, sports.

## 2019-05-18 ENCOUNTER — Other Ambulatory Visit: Payer: Self-pay

## 2019-05-18 ENCOUNTER — Encounter: Payer: Self-pay | Admitting: Internal Medicine

## 2019-05-18 ENCOUNTER — Ambulatory Visit (INDEPENDENT_AMBULATORY_CARE_PROVIDER_SITE_OTHER): Payer: Medicare PPO | Admitting: Internal Medicine

## 2019-05-18 VITALS — BP 104/64 | HR 68 | Temp 97.2°F | Ht 66.0 in | Wt 169.0 lb

## 2019-05-18 DIAGNOSIS — E785 Hyperlipidemia, unspecified: Secondary | ICD-10-CM | POA: Diagnosis not present

## 2019-05-18 DIAGNOSIS — Z7189 Other specified counseling: Secondary | ICD-10-CM

## 2019-05-18 DIAGNOSIS — Z0000 Encounter for general adult medical examination without abnormal findings: Secondary | ICD-10-CM | POA: Diagnosis not present

## 2019-05-18 DIAGNOSIS — M810 Age-related osteoporosis without current pathological fracture: Secondary | ICD-10-CM | POA: Diagnosis not present

## 2019-05-18 DIAGNOSIS — Z23 Encounter for immunization: Secondary | ICD-10-CM

## 2019-05-18 LAB — LIPID PANEL
Cholesterol: 282 mg/dL — ABNORMAL HIGH (ref 0–200)
HDL: 69.5 mg/dL (ref >39.00)
LDL Cholesterol: 182 mg/dL — ABNORMAL HIGH (ref 0–99)
NonHDL: 212.42
Total CHOL/HDL Ratio: 4
Triglycerides: 154 mg/dL — ABNORMAL HIGH (ref 0.0–149.0)
VLDL: 30.8 mg/dL (ref 0.0–40.0)

## 2019-05-18 LAB — CBC
HCT: 38.3 % (ref 36.0–46.0)
Hemoglobin: 12.7 g/dL (ref 12.0–15.0)
MCHC: 33.1 g/dL (ref 30.0–36.0)
MCV: 99.3 fl (ref 78.0–100.0)
Platelets: 278 10*3/uL (ref 150.0–400.0)
RBC: 3.85 Mil/uL — ABNORMAL LOW (ref 3.87–5.11)
RDW: 13.1 % (ref 11.5–15.5)
WBC: 4.1 10*3/uL (ref 4.0–10.5)

## 2019-05-18 LAB — COMPREHENSIVE METABOLIC PANEL
ALT: 14 U/L (ref 0–35)
AST: 19 U/L (ref 0–37)
Albumin: 4.5 g/dL (ref 3.5–5.2)
Alkaline Phosphatase: 47 U/L (ref 39–117)
BUN: 14 mg/dL (ref 6–23)
CO2: 31 mEq/L (ref 19–32)
Calcium: 9.5 mg/dL (ref 8.4–10.5)
Chloride: 103 mEq/L (ref 96–112)
Creatinine, Ser: 0.81 mg/dL (ref 0.40–1.20)
GFR: 70.31 mL/min (ref >60.00)
Glucose, Bld: 95 mg/dL (ref 70–99)
Potassium: 4.1 mEq/L (ref 3.5–5.1)
Sodium: 137 mEq/L (ref 135–145)
Total Bilirubin: 0.5 mg/dL (ref 0.2–1.2)
Total Protein: 7.1 g/dL (ref 6.0–8.3)

## 2019-05-18 NOTE — Assessment & Plan Note (Signed)
I have personally reviewed the Medicare Annual Wellness questionnaire and have noted  1. The patient's medical and social history  2. Their use of alcohol, tobacco or illicit drugs  3. Their current medications and supplements  4. The patient's functional ability including ADL's, fall risks, home safety risks and hearing or visual              impairment.  5. Diet and physical activities  6. Evidence for depression or mood disorders  The patients weight, height, BMI and visual acuity have been recorded in the chart  I have made referrals, counseling and provided education to the patient based review of the above and I have provided the pt with a written personalized care plan for preventive services.   I have provided you with a copy of your personalized plan for preventive services. Please take the time to review along with your updated medication list.  Pneumovax today Flu vaccine in the fall Consider colon/FIT in 2030  Mammogram every 2 years---due 9/22 Eats healthy and exercises

## 2019-05-18 NOTE — Assessment & Plan Note (Signed)
Does regular weight bearing exercise Will plan repeat DEXA in 2023--if worse, would start bisphosphonate then Calcium/vitamin D in MVI

## 2019-05-18 NOTE — Assessment & Plan Note (Signed)
Low risk other than the elevated cholesterol Discussed primary prevention with statin--will hold off

## 2019-05-18 NOTE — Addendum Note (Signed)
Addended by: Eual Fines on: 05/18/2019 10:16 AM   Modules accepted: Orders

## 2019-05-18 NOTE — Assessment & Plan Note (Signed)
See social history 

## 2019-05-18 NOTE — Progress Notes (Signed)
Subjective:    Patient ID: Charlotte Wright, female    DOB: Jan 15, 1951, 69 y.o.   MRN: [id number]  HPI Here for Medicare wellness visit and follow up of chronic health conditions This visit occurred during the SARS-CoV-2 public health emergency.  Safety protocols were in place, including screening questions prior to the visit, additional usage of staff PPE, and extensive cleaning of exam room while observing appropriate contact time as indicated for disinfecting solutions.   Reviewed form and advanced directives Reviewed other doctors No alcohol or tobacco Exercises regularly Vision and hearing are fine No falls No depression or anhedonia (happy being at home--did okay with quarantine) Independent with instrumental ADLs No sig memory issues  Has right shoulder issue from starting lawn mower This is better No other back or joint issues  On calcium and vitamin D in multivitamin Inconsistent with this though Reviewed osteoporosis in hip on DEXA--3 years ago  Known high cholesterol Only FH is heart issue in his 30's--might have been stress related GF and dad may have had vascular dementia as well (and dad died of CVA)  Current Outpatient Medications on File Prior to Visit  Medication Sig Dispense Refill  . Multiple Vitamin (MULTIVITAMIN) tablet Take 1 tablet by mouth daily.     No current facility-administered medications on file prior to visit.    Allergies  Allergen Reactions  . Penicillins     REACTION: rash  . Sulfa Antibiotics Rash    Past Medical History:  Diagnosis Date  . Anxiety   . ASCUS favor benign 8/13  . Hyperlipidemia   . Osteoporosis    osteopenia  . Ovarian cyst 1970's   right    Past Surgical History:  Procedure Laterality Date  . BREAST BIOPSY  6/13   negative  . BREAST LUMPECTOMY  1986   right  . TUBAL LIGATION    . VAGINAL DELIVERY     x5    Family History  Problem Relation Age of Onset  . Multiple sclerosis Mother   . Arthritis  Brother        multiple back surgeries  . Asthma Brother   . Heart disease Maternal Aunt   . Colon cancer Neg Hx   . Colon polyps Neg Hx   . Esophageal cancer Neg Hx   . Stomach cancer Neg Hx   . Rectal cancer Neg Hx     Social History   Socioeconomic History  . Marital status: Married    Spouse name: Not on file  . Number of children: 5  . Years of education: Not on file  . Highest education level: Not on file  Occupational History  . Occupation: Retail buyer --retired  Tobacco Use  . Smoking status: Never Smoker  . Smokeless tobacco: Never Used  Substance and Sexual Activity  . 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 Drug use: No  . Sexual activity: Not on file  Other Topics Concern  . 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   Finished Master's in [place]      Has living will   Husband, then daughter Dahlia Client, would be health care POA   Would accept resuscitation but no prolonged ventilation   No tube feeds if cognitively unaware   Social Determinants of Health   Financial Resource Strain:   . Difficulty of Paying Living Expenses:   Food Insecurity:   .  Worried About Charity fundraiser in the Last Year:   . Arboriculturist in the Last Year:   Transportation Needs:   . Film/video editor (Medical):   Marland Kitchen Lack of Transportation (Non-Medical):   Physical Activity:   . Days of Exercise per Week:   . Minutes of Exercise per Session:   Stress:   . Feeling of Stress :   Social Connections:   . Frequency of Communication with Friends and Family:   . Frequency of Social Gatherings with Friends and Family:   . Attends Religious Services:   . Active Member of Clubs or Organizations:   . Attends Archivist Meetings:   Marland Kitchen Marital Status:   Intimate Partner Violence:   . Fear of Current or Ex-Partner:   . Emotionally Abused:   Marland Kitchen Physically  Abused:   . Sexually Abused:    Review of Systems Appetite is fine Weight is stable Sleeps well Wears seat belt Teeth okay---keeps up with dentist (wears retainers at night) No rash or suspicious skin lesions No heartburn or dysphagia Bowels are fine--no blood No urinary problems--no incontinence No chest pain or palpitations No SOB Some sinus drainage in spring. No meds. No cough No dizziness or syncope    Objective:   Physical Exam  Constitutional: She is oriented to person, place, and time. She appears well-developed. No distress.  HENT:  Mouth/Throat: Oropharynx is clear and moist.  No oral lesions  Neck: No thyromegaly present.  Cardiovascular: Normal rate, regular rhythm, normal heart sounds and intact distal pulses. Exam reveals no gallop.  No murmur heard. Respiratory: Effort normal and breath sounds normal. No respiratory distress. She has no wheezes. She has no rales.  GI: Soft. There is no abdominal tenderness.  Musculoskeletal:        General: No tenderness or edema.  Lymphadenopathy:    She has no cervical adenopathy.  Neurological: She is alert and oriented to person, place, and time.  President--- "Edmon Crape, Trump, Obama" [phone] D-l-r-o-w Recall 3/3  Skin: No rash noted. No erythema.  Psychiatric: She has a normal mood and affect. Her behavior is normal.           Assessment & Plan:

## 2019-05-18 NOTE — Progress Notes (Signed)
Hearing Screening   Method: Audiometry   125Hz  250Hz  500Hz  1000Hz  2000Hz  3000Hz  4000Hz  6000Hz  8000Hz   Right ear:   25 25 25  25     Left ear:   25 25 25  25       Visual Acuity Screening   Right eye Left eye Both eyes  Without correction:     With correction: 20/15 20/20 20/20

## 2019-05-19 LAB — HEPATITIS C ANTIBODY
Hepatitis C Ab: NONREACTIVE
SIGNAL TO CUT-OFF: 0

## 2020-05-15 ENCOUNTER — Other Ambulatory Visit: Payer: Self-pay | Admitting: Internal Medicine

## 2020-05-15 DIAGNOSIS — Z1231 Encounter for screening mammogram for malignant neoplasm of breast: Secondary | ICD-10-CM

## 2020-05-19 ENCOUNTER — Encounter: Payer: Self-pay | Admitting: Internal Medicine

## 2020-05-19 ENCOUNTER — Ambulatory Visit (INDEPENDENT_AMBULATORY_CARE_PROVIDER_SITE_OTHER): Payer: Medicare PPO | Admitting: Internal Medicine

## 2020-05-19 ENCOUNTER — Other Ambulatory Visit: Payer: Self-pay

## 2020-05-19 VITALS — BP 118/72 | HR 67 | Temp 97.2°F | Ht 66.0 in | Wt 168.0 lb

## 2020-05-19 DIAGNOSIS — Z7189 Other specified counseling: Secondary | ICD-10-CM

## 2020-05-19 DIAGNOSIS — M810 Age-related osteoporosis without current pathological fracture: Secondary | ICD-10-CM | POA: Diagnosis not present

## 2020-05-19 DIAGNOSIS — Z0000 Encounter for general adult medical examination without abnormal findings: Secondary | ICD-10-CM | POA: Diagnosis not present

## 2020-05-19 DIAGNOSIS — E785 Hyperlipidemia, unspecified: Secondary | ICD-10-CM

## 2020-05-19 LAB — CBC
HCT: 37.4 % (ref 36.0–46.0)
Hemoglobin: 12.5 g/dL (ref 12.0–15.0)
MCHC: 33.3 g/dL (ref 30.0–36.0)
MCV: 98.2 fl (ref 78.0–100.0)
Platelets: 244 10*3/uL (ref 150.0–400.0)
RBC: 3.81 Mil/uL — ABNORMAL LOW (ref 3.87–5.11)
RDW: 13.2 % (ref 11.5–15.5)
WBC: 3.4 10*3/uL — ABNORMAL LOW (ref 4.0–10.5)

## 2020-05-19 LAB — COMPREHENSIVE METABOLIC PANEL
ALT: 55 U/L — ABNORMAL HIGH (ref 0–35)
AST: 37 U/L (ref 0–37)
Albumin: 4.4 g/dL (ref 3.5–5.2)
Alkaline Phosphatase: 41 U/L (ref 39–117)
BUN: 16 mg/dL (ref 6–23)
CO2: 29 mEq/L (ref 19–32)
Calcium: 9.5 mg/dL (ref 8.4–10.5)
Chloride: 105 mEq/L (ref 96–112)
Creatinine, Ser: 0.77 mg/dL (ref 0.40–1.20)
GFR: 78.93 mL/min (ref >60.00)
Glucose, Bld: 93 mg/dL (ref 70–99)
Potassium: 4.2 mEq/L (ref 3.5–5.1)
Sodium: 141 mEq/L (ref 135–145)
Total Bilirubin: 0.5 mg/dL (ref 0.2–1.2)
Total Protein: 6.7 g/dL (ref 6.0–8.3)

## 2020-05-19 LAB — T4, FREE: Free T4: 0.63 ng/dL (ref 0.60–1.60)

## 2020-05-19 LAB — LIPID PANEL
Cholesterol: 259 mg/dL — ABNORMAL HIGH (ref 0–200)
HDL: 71.7 mg/dL (ref >39.00)
LDL Cholesterol: 169 mg/dL — ABNORMAL HIGH (ref 0–99)
NonHDL: 187.39
Total CHOL/HDL Ratio: 4
Triglycerides: 94 mg/dL (ref 0.0–149.0)
VLDL: 18.8 mg/dL (ref 0.0–40.0)

## 2020-05-19 LAB — VITAMIN D 25 HYDROXY (VIT D DEFICIENCY, FRACTURES): VITD: 32.67 ng/mL (ref 30.00–100.00)

## 2020-05-19 MED ORDER — TRIAMCINOLONE ACETONIDE 0.1 % EX CREA: 1.0000 "application " | TOPICAL_CREAM | Freq: Two times a day (BID) | CUTANEOUS | 1 refills | Status: DC | PRN

## 2020-05-19 NOTE — Assessment & Plan Note (Signed)
See social history 

## 2020-05-19 NOTE — Progress Notes (Signed)
Hearing Screening   Method: Audiometry   125Hz 250Hz 500Hz 1000Hz 2000Hz 3000Hz 4000Hz 6000Hz 8000Hz  Right ear:   20 20 20  20    Left ear:   20 20 20  20      Visual Acuity Screening   Right eye Left eye Both eyes  Without correction:     With correction: 20/20 20/20 20/15    

## 2020-05-19 NOTE — Assessment & Plan Note (Signed)
I have personally reviewed the Medicare Annual Wellness questionnaire and have noted 1. The patient's medical and social history 2. Their use of alcohol, tobacco or illicit drugs 3. Their current medications and supplements 4. The patient's functional ability including ADL's, fall risks, home safety risks and hearing or visual             impairment. 5. Diet and physical activities 6. Evidence for depression or mood disorders  The patients weight, height, BMI and visual acuity have been recorded in the chart I have made referrals, counseling and provided education to the patient based review of the above and I have provided the pt with a written personalized care plan for preventive services.  I have provided you with a copy of your personalized plan for preventive services. Please take the time to review along with your updated medication list.  Healthy Exercises regularly Flu vaccine in the fall Consider last colon 2030 Mammogram tomorrow

## 2020-05-19 NOTE — Assessment & Plan Note (Signed)
Inconsistent with vitamin D Will check levels Consider bisphosphate next year if DEXA worse

## 2020-05-19 NOTE — Assessment & Plan Note (Signed)
Discussed statin for primary prevention--will hold off

## 2020-05-19 NOTE — Progress Notes (Signed)
Subjective:    Patient ID: Charlotte Wright, female    DOB: Dec 01, 1950, 70 y.o.   MRN: [id number]  HPI Here for Medicare wellness visit and follow up of chronic health conditions This visit occurred during the SARS-CoV-2 public health emergency.  Safety protocols were in place, including screening questions prior to the visit, additional usage of staff PPE, and extensive cleaning of exam room while observing appropriate contact time as indicated for disinfecting solutions.   Reviewed form and advanced directives Reviewed other doctors No alcohol or tobacco Exercises regularly Vision are hearing are good No falls No depression or anhedonia Independent with instrumental ADLs No sig memory problems  Doing well Is going out more but still fairly sheltered Not really consistent with the multivitamin Has weight bearing exercise---started Silver Sneakers also  Known high cholesterol No sig CAD history in family [facility] No chest pain or SOB No dizziness or syncope No edema  Current Outpatient Medications on File Prior to Visit  Medication Sig Dispense Refill  . Multiple Vitamin (MULTIVITAMIN) tablet Take 1 tablet by mouth daily.     No current facility-administered medications on file prior to visit.    Allergies  Allergen Reactions  . Penicillins     REACTION: rash  . Sulfa Antibiotics Rash    Past Medical History:  Diagnosis Date  . Anxiety   . ASCUS favor benign 8/13  . Hyperlipidemia   . Osteoporosis    osteopenia  . Ovarian cyst 1970's   right    Past Surgical History:  Procedure Laterality Date  . BREAST BIOPSY  6/13   negative  . BREAST LUMPECTOMY  1986   right  . TUBAL LIGATION    . VAGINAL DELIVERY     x5    Family History  Problem Relation Age of Onset  . Multiple sclerosis Mother   . Arthritis Brother        multiple back surgeries  . Asthma Brother   . Heart disease Maternal Aunt   . Colon cancer Neg Hx   . Colon polyps  Neg Hx   . Esophageal cancer Neg Hx   . Stomach cancer Neg Hx   . Rectal cancer Neg Hx     Social History   Socioeconomic History  . Marital status: Married    Spouse name: Not on file  . Number of children: 5  . Years of education: Not on file  . Highest education level: Not on file  Occupational History  . Occupation: Retail buyer --retired  Tobacco Use  . Smoking status: Never Smoker  . Smokeless tobacco: Never Used  Substance and Sexual Activity  . 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 Drug use: No  . Sexual activity: Not on file  Other Topics Concern  . 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   Finished Master's in [place]      Has living will   Husband, then daughter Dahlia Client, would be health care POA   Would accept resuscitation but no prolonged ventilation   No tube feeds if cognitively unaware   Social Determinants of Health   Financial Resource Strain: Not on file  Food Insecurity: Not on file  Transportation Needs: Not on file  Physical Activity: Not on file  Stress: Not on file  Social Connections: Not on file  Intimate Partner Violence: Not  on file   Review of Systems Appetite is good Weight is stable Sleeps well Wears seat belt Teeth are fine-keeps up with dentist No suspicious skin lesions----discussed establishing with derm No sig back or joint pains---some stiffness getting out of chair No heartburn or dysphagia Bowels move fine--no blood Voids well--no incontinence    Objective:   Physical Exam Constitutional:      Appearance: Normal appearance.  HENT:     Mouth/Throat:     Comments: No lesions Eyes:     Conjunctiva/sclera: Conjunctivae normal.     Pupils: Pupils are equal, round, and reactive to light.  Cardiovascular:     Rate and Rhythm: Normal rate and regular rhythm.     Pulses: Normal pulses.     Heart sounds: No  murmur heard. No gallop.   Pulmonary:     Effort: Pulmonary effort is normal.     Breath sounds: Normal breath sounds. No wheezing or rales.  Abdominal:     Palpations: Abdomen is soft.     Tenderness: There is no abdominal tenderness.  Musculoskeletal:     Cervical back: Neck supple.     Right lower leg: No edema.     Left lower leg: No edema.  Lymphadenopathy:     Cervical: No cervical adenopathy.  Skin:    General: Skin is warm.     Findings: No rash.     Comments: Resolving contact derm on legs  Neurological:     Mental Status: She is alert and oriented to person, place, and time.     Comments: President--"Joe Biden, Trump, Obama" [id number] D-l-r-o-w Recall 3/3  Psychiatric:        Mood and Affect: Mood normal.        Behavior: Behavior normal.            Assessment & Plan:

## 2020-05-20 ENCOUNTER — Ambulatory Visit
Admission: RE | Admit: 2020-05-20 | Discharge: 2020-05-20 | Disposition: A | Discharge status: Home / Self Care | Payer: Medicare PPO | Source: Ambulatory Visit | Attending: Internal Medicine | Admitting: Internal Medicine

## 2020-05-20 DIAGNOSIS — Z1231 Encounter for screening mammogram for malignant neoplasm of breast: Secondary | ICD-10-CM

## 2020-11-17 ENCOUNTER — Ambulatory Visit
Admission: EM | Admit: 2020-11-17 | Discharge: 2020-11-17 | Disposition: A | Discharge status: Home / Self Care | Payer: Medicare PPO | Attending: Emergency Medicine | Admitting: Emergency Medicine

## 2020-11-17 ENCOUNTER — Encounter: Payer: Self-pay | Admitting: Emergency Medicine

## 2020-11-17 ENCOUNTER — Other Ambulatory Visit: Payer: Self-pay

## 2020-11-17 DIAGNOSIS — J101 Influenza due to other identified influenza virus with other respiratory manifestations: Secondary | ICD-10-CM

## 2020-11-17 LAB — POCT INFLUENZA A/B
Influenza A, POC: POSITIVE — AB
Influenza B, POC: NEGATIVE

## 2020-11-17 MED ORDER — OSELTAMIVIR PHOSPHATE 75 MG PO CAPS: 75.0000 mg | ORAL_CAPSULE | Freq: Two times a day (BID) | ORAL | 0 refills | Status: DC

## 2020-11-17 NOTE — Discharge Instructions (Addendum)
Take the Tamiflu as directed.  Take Tylenol or ibuprofen as needed for fever or discomfort.  Follow-up with your primary care provider if your symptoms are not improving.  

## 2020-11-17 NOTE — ED Triage Notes (Signed)
Pt presents for fever, fatigue, nausea, cough, bodyaches, and HA x 3 days

## 2020-11-17 NOTE — ED Provider Notes (Signed)
Renaldo Fiddler    CSN: [id number] Arrival date & time: 11/17/20  1136      History   Chief Complaint Chief Complaint  Patient presents with   Fever        Fatigue   Cough   Headache    HPI Charlotte Wright is a 70 y.o. female.  Patient presents with 2-day history of fever, fatigue, body aches, headache, cough, nausea.  She denies shortness of breath, vomiting, diarrhea, or other symptoms.  Treatment at home with Tylenol and cough syrup; last taken during the night.  Medical history includes osteoporosis, hyperlipidemia, anxiety.   The history is provided by the patient and medical records.   Past Medical History:  Diagnosis Date   Anxiety    ASCUS favor benign 8/13   Hyperlipidemia    Osteoporosis    osteopenia   Ovarian cyst 1970's   right    Patient Active Problem List   Diagnosis Date Noted   Osteoporosis    Advance directive discussed with patient 09/27/2016   Routine general medical examination at a health care facility 09/10/2011   Hyperlipemia 11/13/2007    Past Surgical History:  Procedure Laterality Date   BREAST BIOPSY  6/13   negative   BREAST LUMPECTOMY  1986   right   TUBAL LIGATION     VAGINAL DELIVERY     x5    OB History   No obstetric history on file.      Home Medications    Prior to Admission medications   Medication Sig Start Date End Date Taking? Authorizing Provider  Multiple Vitamin (MULTIVITAMIN) tablet Take 1 tablet by mouth daily.   Yes [provider]  triamcinolone cream (KENALOG) 0.1 % Apply 1 application topically 2 (two) times daily as needed. 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 Negative for chills.  HENT:  Negative for ear pain and sore throat.   Respiratory:  Positive for cough. Negative for shortness of breath.   Cardiovascular:  Negative for chest pain and palpitations.  Gastrointestinal:  Negative for diarrhea and vomiting.  Skin:  Negative for color change and rash.  Neurological:  Positive for headaches. Negative for dizziness.  All other systems reviewed and are negative.   Physical Exam Triage Vital Signs ED Triage Vitals [11/17/20 1207]  Enc Vitals Group     BP 137/79     Pulse Rate 92     Resp 18     Temp (!) 101.1 F (38.4 C)     Temp src  SpO2 98 %     Weight      Height      Head Circumference      Peak Flow      Pain Score      Pain Loc      Pain Edu?      Excl. in GC?    No data found.  Updated Vital Signs BP 137/79 (BP Location: Left Arm)   Pulse 92   Temp (!) 101.1 F (38.4 C)   Resp 18   SpO2 98%   Visual Acuity Right Eye Distance:   Left Eye Distance:   Bilateral Distance:    Right Eye Near:   Left Eye Near:    Bilateral Near:     Physical Exam Vitals and nursing note reviewed.  Constitutional:      General: She is not in acute distress.    Appearance: She is well-developed.  HENT:     Head: Normocephalic and atraumatic.     Right Ear: Tympanic membrane normal.     Left Ear: Tympanic membrane normal.     Nose: Nose normal.     Mouth/Throat:     Mouth: Mucous membranes are moist.     Pharynx: Oropharynx is clear.  Eyes:     Conjunctiva/sclera: Conjunctivae normal.  Cardiovascular:     Rate  and Rhythm: Normal rate and regular rhythm.     Heart sounds: Normal heart sounds.  Pulmonary:     Effort: Pulmonary effort is normal. No respiratory distress.     Breath sounds: Normal breath sounds.  Abdominal:     Palpations: Abdomen is soft.     Tenderness: There is no abdominal tenderness.  Musculoskeletal:     Cervical back: Neck supple.  Skin:    General: Skin is warm and dry.  Neurological:     Mental Status: She is alert.  Psychiatric:        Mood and Affect: Mood normal.        Behavior: Behavior normal.     UC Treatments / Results  Labs (all labs ordered are listed, but only abnormal results are displayed) Labs Reviewed  POCT INFLUENZA A/B - Abnormal; Notable for the following components:      Result Value   Influenza A, POC Positive (*)    All other components within normal limits    EKG   Radiology No results found.  Procedures Procedures (including critical care time)  Medications Ordered in UC Medications - No data to display  Initial Impression / Assessment and Plan / UC Course  I have reviewed the triage vital signs and the nursing notes.  Pertinent labs & imaging results that were available during my care of the patient were reviewed by me and considered in my medical decision making (see chart for details).   Influenza A.  Rapid flu test positive for influenza A.  Treating with Tamiflu.  Discussed symptomatic treatment including Tylenol as needed.  Education provided on influenza.  Instructed patient to follow-up with PCP if her symptoms are not improving.  She agrees to plan of care.    Final Clinical Impressions(s) / UC Diagnoses   Final diagnoses:  Influenza A     Discharge Instructions      Take the Tamiflu as directed.  Take Tylenol or ibuprofen as needed for fever or discomfort.  Follow-up with your primary care provider if your symptoms are not improving.      ED Prescriptions   None  PDMP not reviewed this encounter.    Mickie Bail, NP 11/17/20 1246

## 2020-11-29 ENCOUNTER — Telehealth: Payer: Self-pay | Admitting: Internal Medicine

## 2020-11-29 NOTE — Telephone Encounter (Signed)
Pt called stating that she has an on going cough and that she would like a strong prescription call in at [id number], [place] - [address]

## 2020-11-29 NOTE — Telephone Encounter (Signed)
Spoke to pt. She has been taking Robitussin DM but it does not help. Also sudafed. Cough is not getting better. Asking for something to be called in.

## 2020-11-30 MED ORDER — HYDROCODONE BIT-HOMATROP MBR 5-1.5 MG/5ML PO SOLN
5.0000 mL | Freq: Every evening | ORAL | 0 refills | Status: DC | PRN
Start: 2020-11-30 — End: 2021-05-22

## 2020-11-30 NOTE — Telephone Encounter (Signed)
Spoke to pt. She appreciated the call and medication to try.

## 2020-11-30 NOTE — Telephone Encounter (Signed)
Let her know I sent Rx for narcotic cough syrup---I generally recommend this only at bedtime

## 2020-11-30 NOTE — Addendum Note (Signed)
Addended by: Tillman Abide I on: 11/30/2020 12:51 PM   Modules accepted: Orders

## 2021-01-10 DIAGNOSIS — H43823 Vitreomacular adhesion, bilateral: Secondary | ICD-10-CM | POA: Diagnosis not present

## 2021-01-10 DIAGNOSIS — H2513 Age-related nuclear cataract, bilateral: Secondary | ICD-10-CM | POA: Diagnosis not present

## 2021-01-10 DIAGNOSIS — H524 Presbyopia: Secondary | ICD-10-CM | POA: Diagnosis not present

## 2021-05-22 ENCOUNTER — Encounter: Payer: Self-pay | Admitting: Internal Medicine

## 2021-05-22 ENCOUNTER — Ambulatory Visit (INDEPENDENT_AMBULATORY_CARE_PROVIDER_SITE_OTHER): Payer: Medicare PPO | Admitting: Internal Medicine

## 2021-05-22 VITALS — BP 124/80 | HR 66 | Temp 97.6°F | Ht 66.0 in | Wt 179.0 lb

## 2021-05-22 DIAGNOSIS — Z0000 Encounter for general adult medical examination without abnormal findings: Secondary | ICD-10-CM | POA: Diagnosis not present

## 2021-05-22 DIAGNOSIS — E785 Hyperlipidemia, unspecified: Secondary | ICD-10-CM | POA: Diagnosis not present

## 2021-05-22 DIAGNOSIS — M810 Age-related osteoporosis without current pathological fracture: Secondary | ICD-10-CM

## 2021-05-22 LAB — COMPREHENSIVE METABOLIC PANEL
ALT: 52 U/L — ABNORMAL HIGH (ref 0–35)
AST: 42 U/L — ABNORMAL HIGH (ref 0–37)
Albumin: 4.5 g/dL (ref 3.5–5.2)
Alkaline Phosphatase: 43 U/L (ref 39–117)
BUN: 19 mg/dL (ref 6–23)
CO2: 28 mEq/L (ref 19–32)
Calcium: 9.7 mg/dL (ref 8.4–10.5)
Chloride: 102 mEq/L (ref 96–112)
Creatinine, Ser: 0.75 mg/dL (ref 0.40–1.20)
GFR: 80.89 mL/min (ref >60.00)
Glucose, Bld: 89 mg/dL (ref 70–99)
Potassium: 4.3 mEq/L (ref 3.5–5.1)
Sodium: 138 mEq/L (ref 135–145)
Total Bilirubin: 0.6 mg/dL (ref 0.2–1.2)
Total Protein: 7.6 g/dL (ref 6.0–8.3)

## 2021-05-22 LAB — CBC
HCT: 40 % (ref 36.0–46.0)
Hemoglobin: 13.1 g/dL (ref 12.0–15.0)
MCHC: 32.8 g/dL (ref 30.0–36.0)
MCV: 103.7 fl — ABNORMAL HIGH (ref 78.0–100.0)
Platelets: 287 10*3/uL (ref 150.0–400.0)
RBC: 3.85 Mil/uL — ABNORMAL LOW (ref 3.87–5.11)
RDW: 12.9 % (ref 11.5–15.5)
WBC: 4.1 10*3/uL (ref 4.0–10.5)

## 2021-05-22 LAB — LIPID PANEL
Cholesterol: 343 mg/dL — ABNORMAL HIGH (ref 0–200)
HDL: 84.9 mg/dL (ref >39.00)
LDL Cholesterol: 232 mg/dL — ABNORMAL HIGH (ref 0–99)
NonHDL: 257.87
Total CHOL/HDL Ratio: 4
Triglycerides: 127 mg/dL (ref 0.0–149.0)
VLDL: 25.4 mg/dL (ref 0.0–40.0)

## 2021-05-22 NOTE — Assessment & Plan Note (Signed)
I have personally reviewed the Medicare Annual Wellness questionnaire and have noted ?1. The patient's medical and social history ?2. Their use of alcohol, tobacco or illicit drugs ?3. Their current medications and supplements ?4. The patient's functional ability including ADL's, fall risks, home safety risks and hearing or visual ?            impairment. ?5. Diet and physical activities ?6. Evidence for depression or mood disorders ? ?The patients weight, height, BMI and visual acuity have been recorded in the chart ?I have made referrals, counseling and provided education to the patient based review of the above and I have provided the pt with a written personalized care plan for preventive services. ? ?I have provided you with a copy of your personalized plan for preventive services. Please take the time to review along with your updated medication list. ? ?Consider colonoscopy in 2030 ?Mammogram every 2 years---due 5/25 ?No pap due to age ?Exercises regularly ?Td at pharmacy ?COVID booster by fall---flu vaccine in the fall ?

## 2021-05-22 NOTE — Patient Instructions (Signed)
Please call [facility]-- [phone] and ask for radiology-----to set up your bone density scan. ?

## 2021-05-22 NOTE — Assessment & Plan Note (Signed)
Is on vitamin D and calcium in MVI ?Will recheck DEXA---if worse, will consider bisphosphate (actonel) ?

## 2021-05-22 NOTE — Assessment & Plan Note (Signed)
Moderate elevations but low risk ?Will recheck but neither of [place] is excited about a statin ?

## 2021-05-22 NOTE — Progress Notes (Signed)
Hearing Screening - Comments:: Passed whisper test Vision Screening - Comments:: July 2022  

## 2021-05-22 NOTE — Progress Notes (Signed)
? ?Subjective:  ? ? Patient ID: Charlotte Wright, female    DOB: 1950-10-19, 71 y.o.   MRN: [id number] ? ?HPI ?Here for Medicare wellness visit and follow up of chronic health conditions ?Reviewed form and advanced directives ?Reviewed other doctors ?No alcohol or tobacco ?Exercises quite a bit ?Vision is fine---early cataracts ?Hearing is good ?No falls ?No depression or anhedonia ?Independent with instrumental ADLs ?No memory problems ? ?Doing great--"had a wonderful year" ?Doing lots--Y six days a week, zumba, [employer] classes, etc ?No new concerns---other than stress with his husband's health issues that defy diagonsis ? ?Known borderline hip osteoporosis on DEXA 2018 ?On calcium/vitamin D ? ?Moderately elevated cholesterol ?She is still not excited about meds ? ?Current Outpatient Medications on File Prior to Visit  ?Medication Sig Dispense Refill  ? Multiple Vitamin (MULTIVITAMIN) tablet Take 1 tablet by mouth daily.    ? triamcinolone cream (KENALOG) 0.1 % Apply 1 application topically 2 (two) times daily as needed. 60 each 1  ? ?No current facility-administered medications on file prior to visit.  ? ? ?Allergies  ?Allergen Reactions  ? Penicillins   ?  REACTION: rash  ? Sulfa Antibiotics Rash  ? ? ?Past Medical History:  ?Diagnosis Date  ? Anxiety   ? ASCUS favor benign 8/13  ? Hyperlipidemia   ? Osteoporosis   ? osteopenia  ? Ovarian cyst 1970's  ? right  ? ? ?Past Surgical History:  ?Procedure Laterality Date  ? BREAST BIOPSY  6/13  ? negative  ? BREAST LUMPECTOMY  1986  ? right  ? TUBAL LIGATION    ? VAGINAL DELIVERY    ? x5  ? ? ?Family History  ?Problem Relation Age of Onset  ? Multiple sclerosis Mother   ? Arthritis Brother   ?     multiple back surgeries  ? Asthma Brother   ? Heart disease Maternal Aunt   ? Colon cancer Neg Hx   ? Colon polyps Neg Hx   ? Esophageal cancer Neg Hx   ? Stomach cancer Neg Hx   ? Rectal cancer Neg Hx   ? ? ?Social History  ? ?Socioeconomic History  ? Marital status:  Married  ?  Spouse name: Not on file  ? Number of children: 5  ? Years of education: Not on file  ? Highest education level: Not on file  ?Occupational History  ? Occupation: Retail buyer --retired  ?Tobacco Use  ? Smoking status: Never  ?  Passive exposure: Past  ? Smokeless tobacco: Never  ?Vaping Use  ? Vaping Use: Never used  ?Substance and Sexual Activity  ? Alcohol use: Not Currently  ?  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 Drug use: No  ? Sexual activity: Not on file  ?Other Topics Concern  ? Not on file  ?Social History Narrative  ? 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  ? Finished Master's in [place]  ?   ? Has living will  ? Husband, then daughter Dahlia Client, would be health care POA  ? Would accept resuscitation but no prolonged ventilation  ? No tube feeds if cognitively unaware  ? ?Social Determinants of Health  ? ?Financial Resource Strain: Not on file  ?Food Insecurity: Not on file  ?Transportation Needs: Not on file  ?Physical Activity: Not on file  ?Stress: Not on file  ?Social Connections: Not on file  ?Intimate Partner Violence: Not on file  ? ?Review of Systems ?Appetite  is good ?Weight is up 10# in the past year ?Sleeps well ?Wears seat belt ?Teeth okay---keeps up with dentist ?No suspicious skin lesions---does have derm visit ?No heartburn or dysphagia ?Bowels move fine--no blood ?No chest pain or SOB ?No dizziness or syncope ?No edema ?No urinary problems---no incontinence (no longer needs pads) ?No sig back or joint pains---just stiff after prolonged sitting ?   ?Objective:  ? Physical Exam ?Constitutional:   ?   Appearance: Normal appearance.  ?HENT:  ?   Mouth/Throat:  ?   Comments: No lesions ?Eyes:  ?   Conjunctiva/sclera: Conjunctivae normal.  ?   Pupils: Pupils are equal, round, and reactive to light.  ?Cardiovascular:  ?   Rate and Rhythm: Normal rate and regular rhythm.  ?   Pulses: Normal pulses.  ?   Heart sounds: No murmur heard. ?  No  gallop.  ?Pulmonary:  ?   Effort: Pulmonary effort is normal.  ?   Breath sounds: Normal breath sounds. No wheezing or rales.  ?Abdominal:  ?   Palpations: Abdomen is soft.  ?   Tenderness: There is no abdominal tenderness.  ?Musculoskeletal:  ?   Cervical back: Neck supple.  ?   Right lower leg: No edema.  ?   Left lower leg: No edema.  ?Lymphadenopathy:  ?   Cervical: No cervical adenopathy.  ?Skin: ?   Findings: No lesion or rash.  ?Neurological:  ?   General: No focal deficit present.  ?   Mental Status: She is alert and oriented to person, place, and time.  ?   Comments: Mini-cog normal  ?Psychiatric:     ?   Mood and Affect: Mood normal.     ?   Behavior: Behavior normal.  ?  ? ? ? ? ?   ?Assessment & Plan:  ? ?

## 2021-05-24 DIAGNOSIS — L814 Other melanin hyperpigmentation: Secondary | ICD-10-CM | POA: Diagnosis not present

## 2021-05-24 DIAGNOSIS — D225 Melanocytic nevi of trunk: Secondary | ICD-10-CM | POA: Diagnosis not present

## 2021-05-24 DIAGNOSIS — L570 Actinic keratosis: Secondary | ICD-10-CM | POA: Diagnosis not present

## 2021-06-25 ENCOUNTER — Telehealth: Payer: Self-pay

## 2021-06-25 NOTE — Telephone Encounter (Signed)
Spoke to pt. She will set up the appt.

## 2021-06-25 NOTE — Telephone Encounter (Signed)
Patient called to make Dexa appointment. Last one was done at [facility]. Was told that their machine is calibrated different. Patient wanted to know if it would make a difference or if she was ok to have done at local imaging as ordered.

## 2021-08-22 ENCOUNTER — Ambulatory Visit
Admission: RE | Admit: 2021-08-22 | Discharge: 2021-08-22 | Disposition: A | Discharge status: Home / Self Care | Payer: Medicare PPO | Source: Ambulatory Visit | Attending: Internal Medicine | Admitting: Internal Medicine

## 2021-08-22 DIAGNOSIS — M8589 Other specified disorders of bone density and structure, multiple sites: Secondary | ICD-10-CM | POA: Diagnosis not present

## 2021-08-22 DIAGNOSIS — Z780 Asymptomatic menopausal state: Secondary | ICD-10-CM | POA: Diagnosis not present

## 2021-08-22 DIAGNOSIS — M810 Age-related osteoporosis without current pathological fracture: Secondary | ICD-10-CM | POA: Diagnosis not present

## 2021-08-23 ENCOUNTER — Encounter: Payer: Self-pay | Admitting: Internal Medicine

## 2022-03-17 IMAGING — MG MM DIGITAL SCREENING BILAT W/ TOMO AND CAD
6 of 10 series · 6 of 30 positions shown · non-contrast
Comparison: Previous exam(s).

CLINICAL DATA: Screening.

EXAM:
DIGITAL SCREENING BILATERAL MAMMOGRAM WITH TOMOSYNTHESIS AND CAD
TECHNIQUE: Bilateral screening digital craniocaudal and mediolateral oblique
mammograms were obtained. Bilateral screening digital breast
tomosynthesis was performed. The images were evaluated with
computer-aided detection.

[R CC synth-2D]
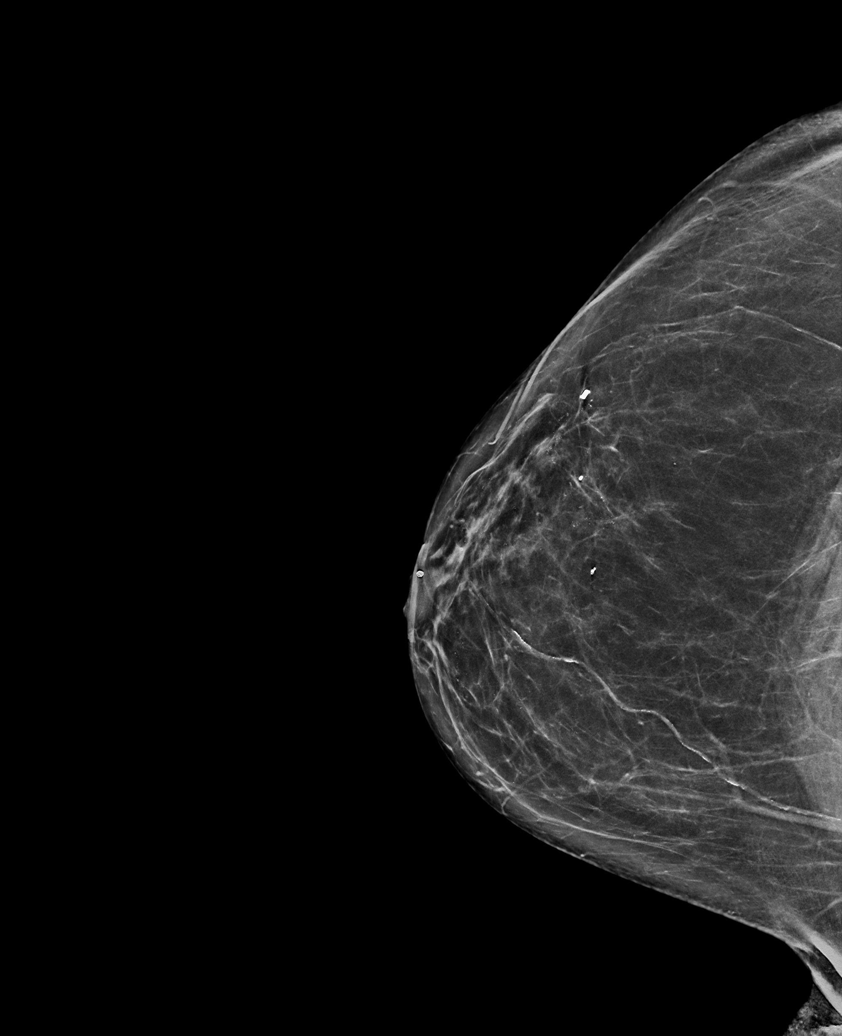

[L CC synth-2D (1 of 2)]
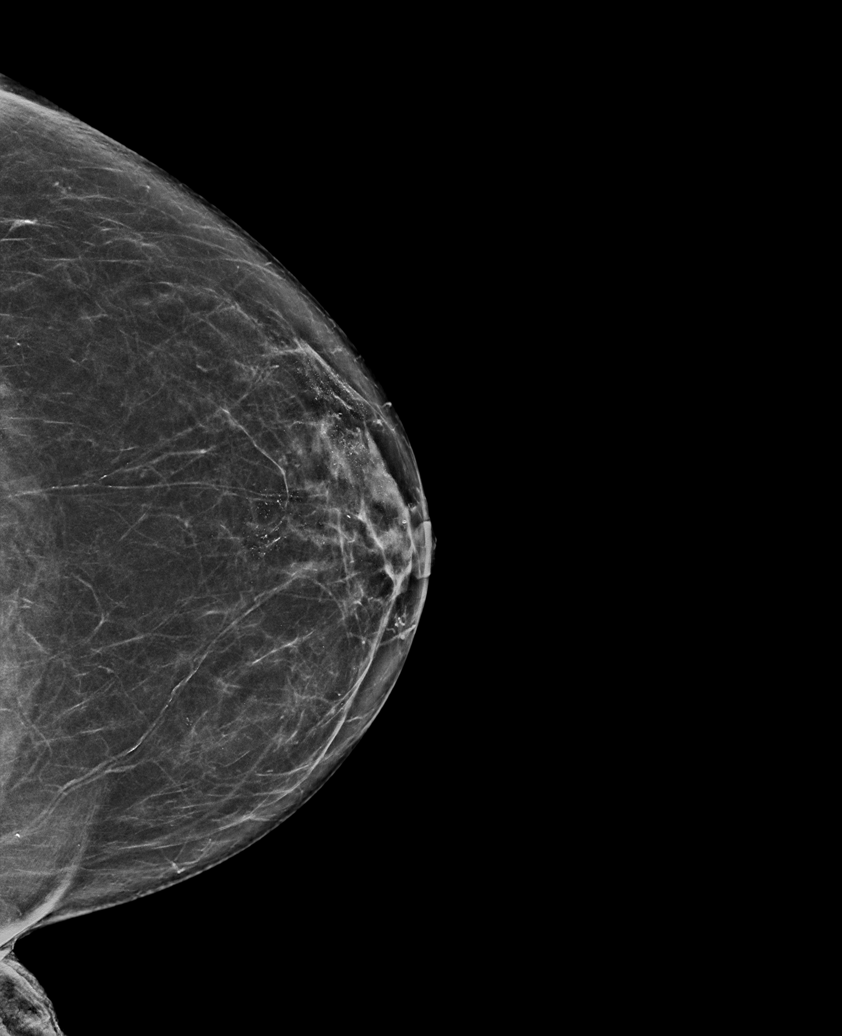

[L MLO synth-2D]
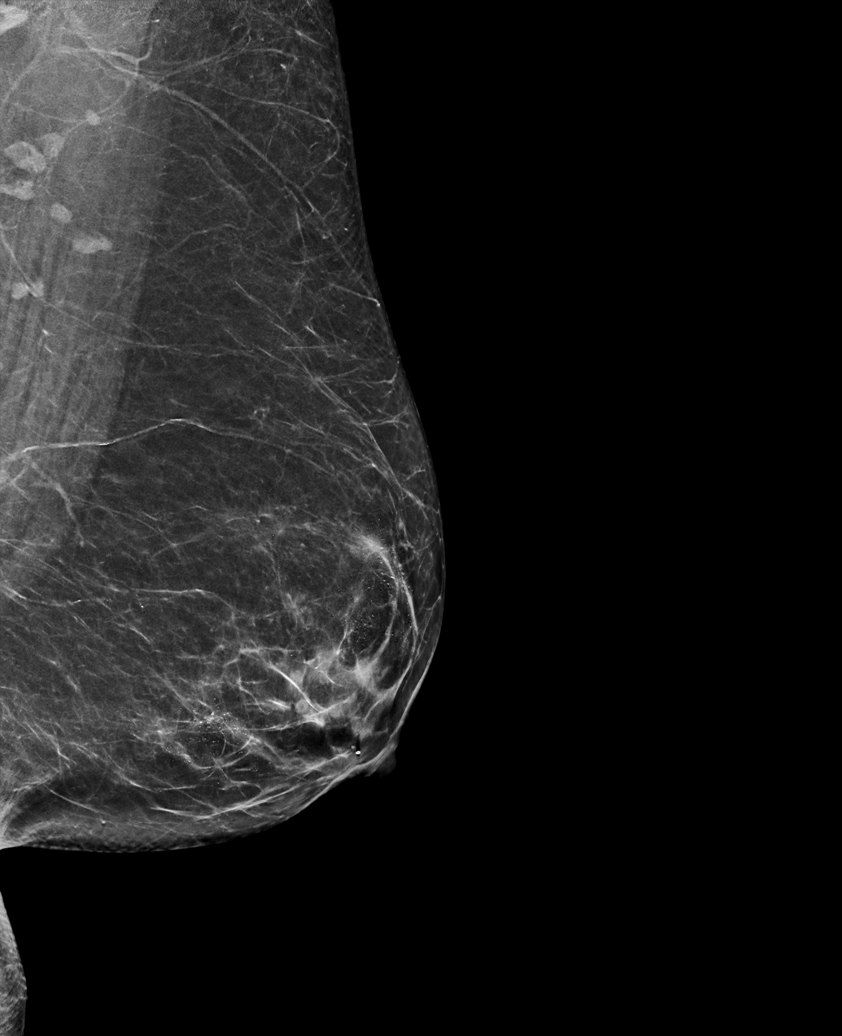

[R MLO synth-2D]
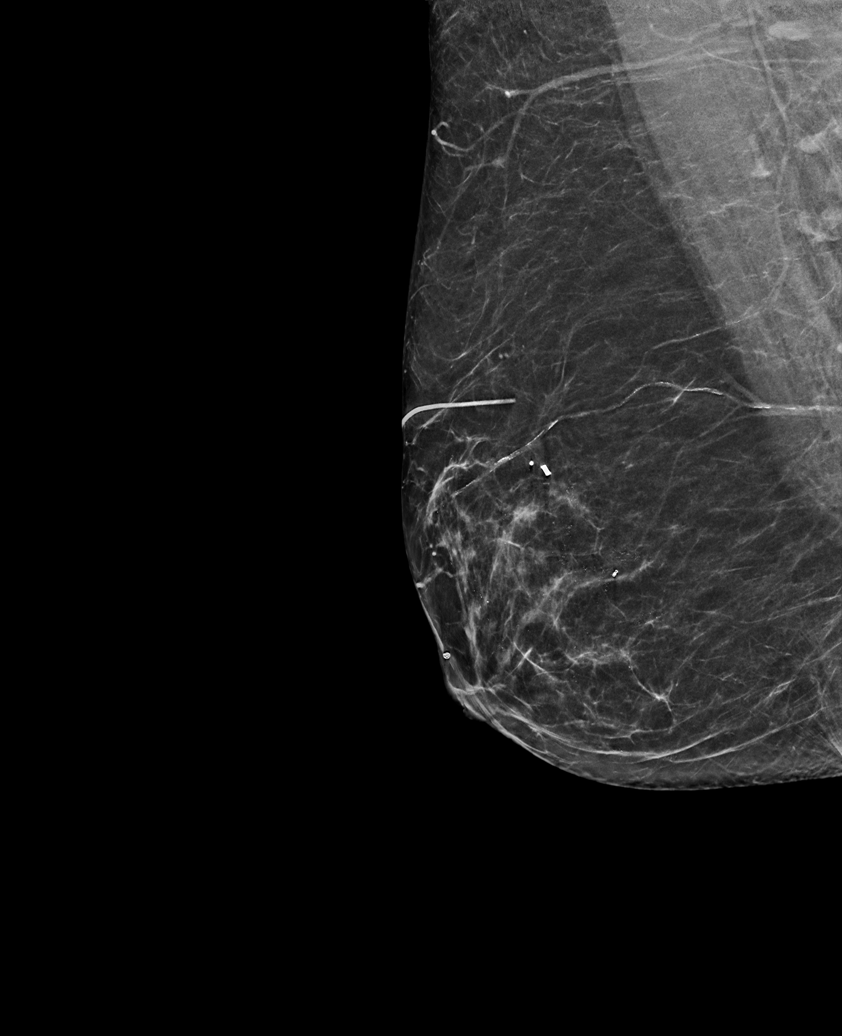

[L CC synth-2D (2 of 2)]
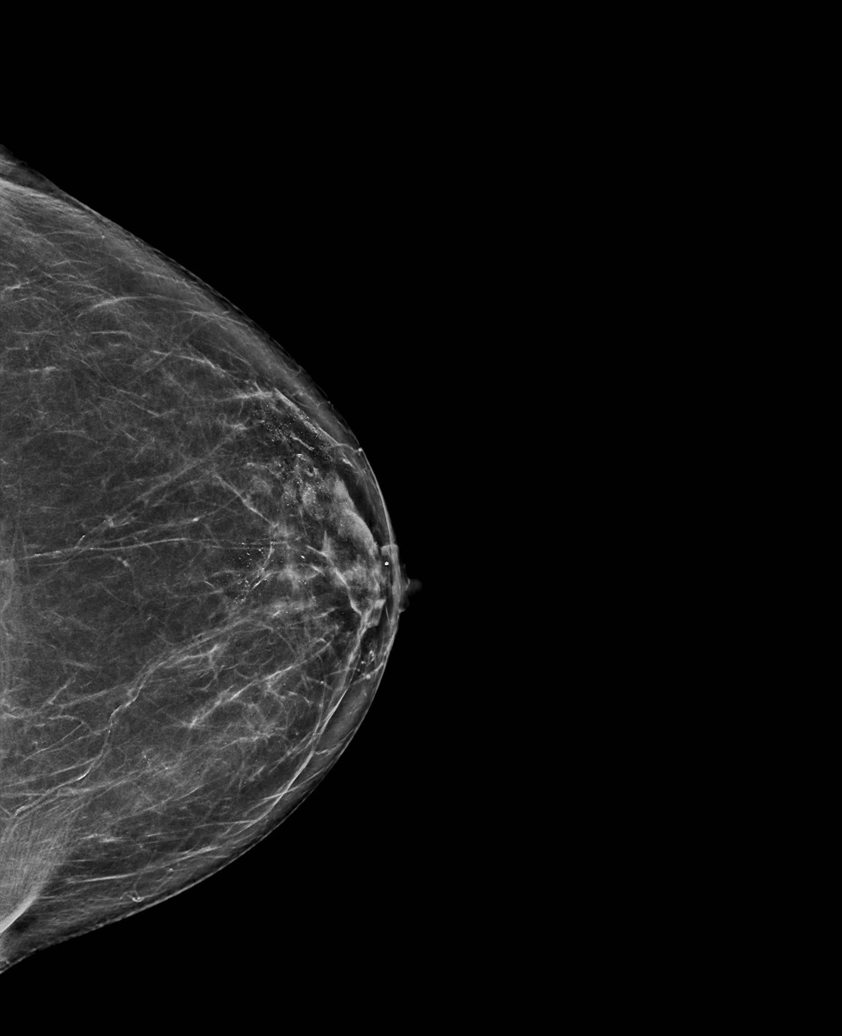

[R CC tomo · tomo slice 37/74.0]
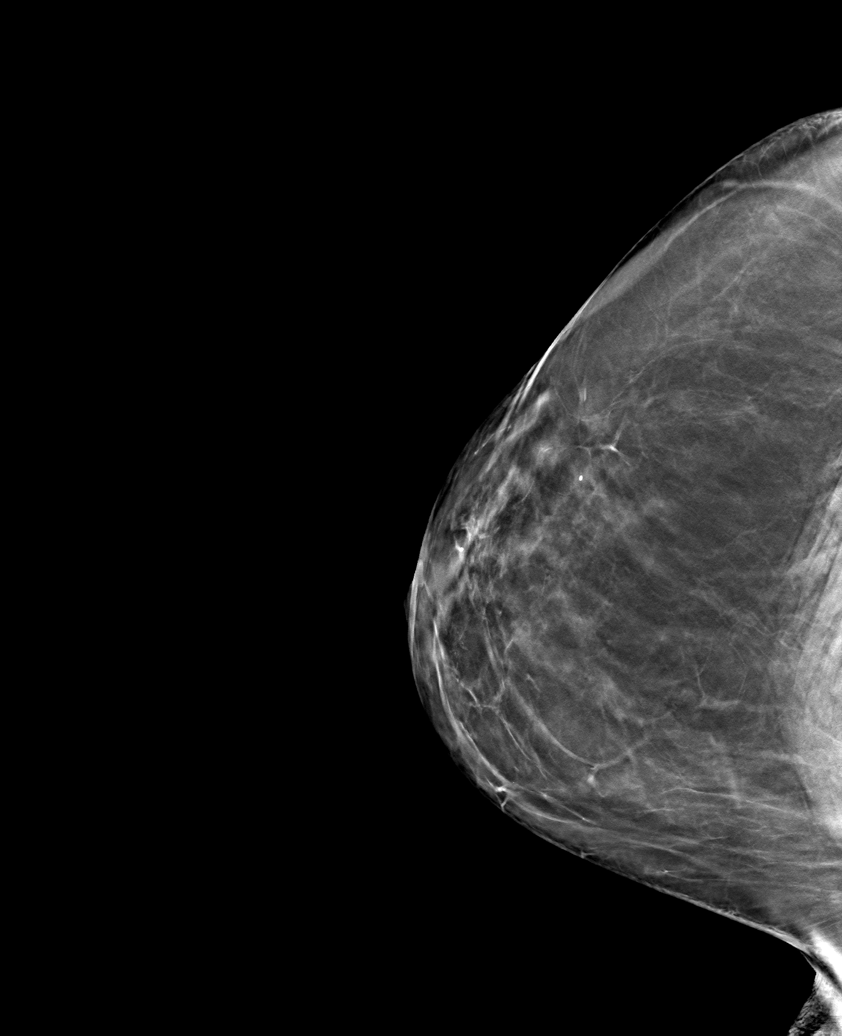

[6 of 30 positions shown; findings below may reference images not displayed]

ACR Breast Density Category b: There are scattered areas of
fibroglandular density.
FINDINGS: There are no findings suspicious for malignancy. The images were
evaluated with computer-aided detection.
IMPRESSION: No mammographic evidence of malignancy. A result letter of this
screening mammogram will be mailed directly to the patient.

RECOMMENDATION:
Screening mammogram in one year. (Code:WJ-[id number])

BI-RADS CATEGORY  1: Negative.

## 2022-04-18 DIAGNOSIS — H11122 Conjunctival concretions, left eye: Secondary | ICD-10-CM | POA: Diagnosis not present

## 2022-05-09 ENCOUNTER — Telehealth: Payer: Self-pay

## 2022-05-09 NOTE — Telephone Encounter (Signed)
Please contact pt to set up Medicare Wellness Exam after 05-23-22. Thanks.

## 2022-05-10 NOTE — Telephone Encounter (Signed)
Spoke to pt's husband, he stated he'd get pt to return our call to schedule

## 2022-05-29 DIAGNOSIS — D2262 Melanocytic nevi of left upper limb, including shoulder: Secondary | ICD-10-CM | POA: Diagnosis not present

## 2022-05-29 DIAGNOSIS — D225 Melanocytic nevi of trunk: Secondary | ICD-10-CM | POA: Diagnosis not present

## 2022-05-29 DIAGNOSIS — L853 Xerosis cutis: Secondary | ICD-10-CM | POA: Diagnosis not present

## 2022-05-29 DIAGNOSIS — D2272 Melanocytic nevi of left lower limb, including hip: Secondary | ICD-10-CM | POA: Diagnosis not present

## 2022-05-29 DIAGNOSIS — L578 Other skin changes due to chronic exposure to nonionizing radiation: Secondary | ICD-10-CM | POA: Diagnosis not present

## 2022-05-29 DIAGNOSIS — D2261 Melanocytic nevi of right upper limb, including shoulder: Secondary | ICD-10-CM | POA: Diagnosis not present

## 2022-05-29 DIAGNOSIS — D2271 Melanocytic nevi of right lower limb, including hip: Secondary | ICD-10-CM | POA: Diagnosis not present

## 2022-05-29 DIAGNOSIS — L821 Other seborrheic keratosis: Secondary | ICD-10-CM | POA: Diagnosis not present

## 2022-06-03 DIAGNOSIS — H2513 Age-related nuclear cataract, bilateral: Secondary | ICD-10-CM | POA: Diagnosis not present

## 2022-06-03 DIAGNOSIS — H268 Other specified cataract: Secondary | ICD-10-CM | POA: Diagnosis not present

## 2022-07-22 ENCOUNTER — Telehealth: Payer: Self-pay

## 2022-07-22 NOTE — Telephone Encounter (Signed)
Started yesterday with a fever, slight cough. Has been resting. Husband tested positive for Covid 4 days ago. She does not feel she needs an antiviral. Feels like it is like a cold. She will take OTC cold meds as needed. She will let us know if she worsens.

## 2022-08-29 ENCOUNTER — Encounter (INDEPENDENT_AMBULATORY_CARE_PROVIDER_SITE_OTHER): Payer: Self-pay

## 2022-09-26 ENCOUNTER — Encounter: Payer: Medicare PPO | Admitting: Internal Medicine

## 2022-11-21 ENCOUNTER — Encounter: Payer: Medicare PPO | Admitting: Internal Medicine

## 2022-11-28 ENCOUNTER — Encounter: Payer: Medicare PPO | Admitting: Internal Medicine

## 2023-02-11 ENCOUNTER — Other Ambulatory Visit: Payer: Self-pay

## 2023-02-11 ENCOUNTER — Telehealth: Payer: Self-pay

## 2023-02-11 DIAGNOSIS — Z1231 Encounter for screening mammogram for malignant neoplasm of breast: Secondary | ICD-10-CM

## 2023-02-11 NOTE — Telephone Encounter (Signed)
Called patient scheduled for mammogram. She will call if any further questions.

## 2023-02-17 ENCOUNTER — Inpatient Hospital Stay: Admission: RE | Admit: 2023-02-17 | Payer: Medicare PPO | Source: Ambulatory Visit

## 2023-03-21 ENCOUNTER — Ambulatory Visit
Admission: RE | Admit: 2023-03-21 | Discharge: 2023-03-21 | Disposition: A | Discharge status: Home / Self Care | Payer: Medicare PPO | Source: Ambulatory Visit | Attending: Internal Medicine | Admitting: Internal Medicine

## 2023-03-21 DIAGNOSIS — Z1231 Encounter for screening mammogram for malignant neoplasm of breast: Secondary | ICD-10-CM | POA: Diagnosis not present

## 2023-03-27 ENCOUNTER — Encounter: Payer: Self-pay | Admitting: Internal Medicine

## 2023-03-27 ENCOUNTER — Other Ambulatory Visit: Payer: Self-pay | Admitting: Internal Medicine

## 2023-03-27 DIAGNOSIS — R928 Other abnormal and inconclusive findings on diagnostic imaging of breast: Secondary | ICD-10-CM

## 2023-04-08 ENCOUNTER — Ambulatory Visit
Admission: RE | Admit: 2023-04-08 | Discharge: 2023-04-08 | Disposition: A | Discharge status: Home / Self Care | Source: Ambulatory Visit | Attending: Internal Medicine | Admitting: Internal Medicine

## 2023-04-08 ENCOUNTER — Ambulatory Visit

## 2023-04-08 ENCOUNTER — Encounter: Payer: Self-pay | Admitting: Internal Medicine

## 2023-04-08 DIAGNOSIS — R928 Other abnormal and inconclusive findings on diagnostic imaging of breast: Secondary | ICD-10-CM

## 2023-05-21 ENCOUNTER — Encounter: Payer: Medicare PPO | Admitting: Internal Medicine

## 2023-06-05 DIAGNOSIS — Z0100 Encounter for examination of eyes and vision without abnormal findings: Secondary | ICD-10-CM | POA: Diagnosis not present

## 2023-06-05 DIAGNOSIS — H2513 Age-related nuclear cataract, bilateral: Secondary | ICD-10-CM | POA: Diagnosis not present

## 2023-06-05 DIAGNOSIS — H268 Other specified cataract: Secondary | ICD-10-CM | POA: Diagnosis not present

## 2023-06-05 DIAGNOSIS — H35371 Puckering of macula, right eye: Secondary | ICD-10-CM | POA: Diagnosis not present

## 2023-06-05 DIAGNOSIS — H43821 Vitreomacular adhesion, right eye: Secondary | ICD-10-CM | POA: Diagnosis not present

## 2023-06-17 DIAGNOSIS — M1711 Unilateral primary osteoarthritis, right knee: Secondary | ICD-10-CM | POA: Diagnosis not present

## 2023-07-09 ENCOUNTER — Encounter: Payer: Self-pay | Admitting: Internal Medicine

## 2023-07-10 ENCOUNTER — Encounter: Payer: Self-pay | Admitting: Primary Care

## 2023-07-10 ENCOUNTER — Ambulatory Visit: Admitting: Primary Care

## 2023-07-10 VITALS — BP 122/70 | HR 72 | Temp 97.2°F | Ht 66.0 in | Wt 183.0 lb

## 2023-07-10 DIAGNOSIS — N950 Postmenopausal bleeding: Secondary | ICD-10-CM | POA: Diagnosis not present

## 2023-07-10 NOTE — Patient Instructions (Signed)
 You will receive a phone call regarding the ultrasound.  It was a pleasure meeting you!

## 2023-07-10 NOTE — Progress Notes (Signed)
 Subjective:    Patient ID: Charlotte Wright, female    DOB: [birth year], 73 y.o.   MRN: [id number]  Vaginal Bleeding The patient's primary symptoms include pelvic pain. The patient's pertinent negatives include no vaginal discharge. Pertinent negatives include no abdominal pain, dysuria, flank pain or frequency.    Charlotte Wright is a very pleasant 73 y.o. female patient of Dr. Jimmy without a significant history who presents today to discuss vaginal bleeding.  Symptom onset three days ago with light brown/red spotting. She then noticed bright red bleeding with tiny clots each time she urinated. She began wearing a panty liner and has noticed brown/red color on the pad. Today she's noticed brown/red bleeding when wiping after urinating and in the panty liner. She has noticed right lower pelvic cramping intermittently   She denies fevers, abdominal pain, dysuria, smoking history, urinary frequency.   She has a family history of lymphoma in her son and breast cancer in her mother.    Review of Systems  Constitutional:  Negative for unexpected weight change.  Gastrointestinal:  Negative for abdominal pain.  Genitourinary:  Positive for pelvic pain and vaginal bleeding. Negative for dysuria, flank pain, frequency and vaginal discharge.         Past Medical History:  Diagnosis Date   Anxiety    ASCUS favor benign 8/13   Hyperlipidemia    Osteoporosis    osteopenia   Ovarian cyst 1970's   right    Social History   Socioeconomic History   Marital status: Married    Spouse name: Not on file   Number of children: 5   Years of education: Not on file   Highest education level: Not on file  Occupational History   Occupation: Retail buyer --retired  Tobacco Use   Smoking status: Never    Passive exposure: Past   Smokeless tobacco: Never  Vaping Use   Vaping status: Never Used  Substance and Sexual Activity   Alcohol use: Not Currently    Comment: quit, did AA and considers  herself in recovery   Drug use: No   Sexual activity: Not on file  Other Topics Concern   Not on file  Social History Narrative   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   Finished Master's in English      Has living will   Husband, then daughter Charlotte Wright, would be health care POA   Would accept resuscitation but no prolonged ventilation   No tube feeds if cognitively unaware   Social Drivers of Health   Financial Resource Strain: Not on file  Food Insecurity: Not on file  Transportation Needs: Not on file  Physical Activity: Not on file  Stress: Not on file  Social Connections: Not on file  Intimate Partner Violence: Not on file    Past Surgical History:  Procedure Laterality Date   BREAST BIOPSY  6/13   negative   BREAST LUMPECTOMY  1986   right   TUBAL LIGATION     VAGINAL DELIVERY     x5    Family History  Problem Relation Age of Onset   Multiple sclerosis Mother    Heart disease Maternal Aunt    Arthritis Brother        multiple back surgeries   Asthma Brother    Colon cancer Neg Hx    Colon polyps Neg Hx    Esophageal cancer Neg Hx    Stomach cancer Neg Hx    Rectal cancer  Neg Hx    Breast cancer Neg Hx     Allergies  Allergen Reactions   Penicillins     REACTION: rash   Sulfa Antibiotics Rash    Current Outpatient Medications on File Prior to Visit  Medication Sig Dispense Refill   FLUZONE HIGH-DOSE 0.5 ML injection      Multiple Vitamin (MULTIVITAMIN) tablet Take 1 tablet by mouth daily.     SPIKEVAX syringe      triamcinolone  cream (KENALOG ) 0.1 % Apply 1 application topically 2 (two) times daily as needed. 60 each 1   No current facility-administered medications on file prior to visit.    BP 122/70   Pulse 72   Temp (!) 97.2 F (36.2 C) (Temporal)   Ht 5' 6 (1.676 m)   Wt 183 lb (83 kg)   SpO2 98%   BMI 29.54 kg/m  Objective:   Physical Exam Constitutional:      General: She is not in acute  distress.  Cardiovascular:     Rate and Rhythm: Normal rate and regular rhythm.  Pulmonary:     Effort: Pulmonary effort is normal.     Breath sounds: Normal breath sounds.  Abdominal:     Palpations: Abdomen is soft.     Tenderness: There is no abdominal tenderness.  Genitourinary:    Vagina: Bleeding present. No tenderness.     Cervix: No cervical motion tenderness or erythema.     Comments: Moderate amount of dark red bleeding in vagina.          Assessment & Plan:  Postmenopausal vaginal bleeding Assessment & Plan: Pelvic exam today confirms.  STAT transvaginal pelvic ultrasound ordered today. Await results.  Orders: -     US  PELVIC COMPLETE WITH TRANSVAGINAL; Future        Melannie Metzner K Srah Ake, NP

## 2023-07-10 NOTE — Assessment & Plan Note (Signed)
 Pelvic exam today confirms.  STAT transvaginal pelvic ultrasound ordered today. Await results.

## 2023-07-11 ENCOUNTER — Ambulatory Visit: Payer: Self-pay | Admitting: Primary Care

## 2023-07-11 ENCOUNTER — Ambulatory Visit
Admission: RE | Admit: 2023-07-11 | Discharge: 2023-07-11 | Disposition: A | Discharge status: Home / Self Care | Source: Ambulatory Visit | Attending: Primary Care

## 2023-07-11 ENCOUNTER — Other Ambulatory Visit: Payer: Self-pay | Admitting: Primary Care

## 2023-07-11 DIAGNOSIS — N950 Postmenopausal bleeding: Secondary | ICD-10-CM | POA: Diagnosis not present

## 2023-07-11 DIAGNOSIS — R9389 Abnormal findings on diagnostic imaging of other specified body structures: Secondary | ICD-10-CM | POA: Diagnosis not present

## 2023-07-11 DIAGNOSIS — D259 Leiomyoma of uterus, unspecified: Secondary | ICD-10-CM | POA: Diagnosis not present

## 2023-07-11 NOTE — Telephone Encounter (Signed)
 Patient is scheduled at [facility], arrive by 1:45pm

## 2023-07-12 ENCOUNTER — Encounter: Payer: Self-pay | Admitting: Internal Medicine

## 2023-07-24 ENCOUNTER — Other Ambulatory Visit (HOSPITAL_COMMUNITY)
Admission: RE | Admit: 2023-07-24 | Discharge: 2023-07-24 | Disposition: A | Discharge status: Home / Self Care | Source: Ambulatory Visit | Attending: Obstetrics and Gynecology | Admitting: Obstetrics and Gynecology

## 2023-07-24 ENCOUNTER — Encounter: Payer: Self-pay | Admitting: Obstetrics and Gynecology

## 2023-07-24 ENCOUNTER — Ambulatory Visit: Admitting: Obstetrics and Gynecology

## 2023-07-24 VITALS — BP 127/81 | HR 70 | Ht 66.0 in | Wt 185.5 lb

## 2023-07-24 DIAGNOSIS — N950 Postmenopausal bleeding: Secondary | ICD-10-CM | POA: Diagnosis not present

## 2023-07-24 DIAGNOSIS — D251 Intramural leiomyoma of uterus: Secondary | ICD-10-CM

## 2023-07-24 DIAGNOSIS — N840 Polyp of corpus uteri: Secondary | ICD-10-CM | POA: Diagnosis not present

## 2023-07-24 DIAGNOSIS — Z7689 Persons encountering health services in other specified circumstances: Secondary | ICD-10-CM

## 2023-07-24 DIAGNOSIS — R9389 Abnormal findings on diagnostic imaging of other specified body structures: Secondary | ICD-10-CM

## 2023-07-24 NOTE — Progress Notes (Signed)
 HPI:      [name] is a 73 y.o. G5P5 who LMP was No LMP recorded. Patient is postmenopausal.  Subjective:   She presents today because she had an episode of postmenopausal bleeding in June.  She reports that she had several days of bleeding very much like her menses.  She has had no bleeding since that. She did have an ultrasound showing uterine fibroids and a thickened endometrium-12 mm.    Hx: The following portions of the patient's history were reviewed and updated as appropriate:             She  has a past medical history of Anxiety, ASCUS favor benign (8/13), Hyperlipidemia, Osteoporosis, and Ovarian cyst (1970's). She does not have any pertinent problems on file. She  has a past surgical history that includes Vaginal delivery; Tubal ligation; Breast lumpectomy (1986); and Breast biopsy (6/13). Her family history includes Arthritis in her brother; Asthma in her brother; Heart disease in her maternal aunt; Multiple sclerosis in her mother. She  reports that she has never smoked. She has been exposed to tobacco smoke. She has never used smokeless tobacco. She reports that she does not currently use alcohol. She reports that she does not use drugs. She has a current medication list which includes the following prescription(s): multivitamin, fluzone high-dose, spikevax, and triamcinolone  cream. She is allergic to penicillins and sulfa antibiotics.       Review of Systems:  Review of Systems  Constitutional: Denied constitutional symptoms, night sweats, recent illness, fatigue, fever, insomnia and weight loss.  Eyes: Denied eye symptoms, eye pain, photophobia, vision change and visual disturbance.  Ears/Nose/Throat/Neck: Denied ear, nose, throat or neck symptoms, hearing loss, nasal discharge, sinus congestion and sore throat.  Cardiovascular: Denied cardiovascular symptoms, arrhythmia, chest pain/pressure, edema, exercise intolerance, orthopnea and palpitations.  Respiratory:  Denied pulmonary symptoms, asthma, pleuritic pain, productive sputum, cough, dyspnea and wheezing.  Gastrointestinal: Denied, gastro-esophageal reflux, melena, nausea and vomiting.  Genitourinary: See HPI for additional information.  Musculoskeletal: Denied musculoskeletal symptoms, stiffness, swelling, muscle weakness and myalgia.  Dermatologic: Denied dermatology symptoms, rash and scar.  Neurologic: Denied neurology symptoms, dizziness, headache, neck pain and syncope.  Psychiatric: Denied psychiatric symptoms, anxiety and depression.  Endocrine: Denied endocrine symptoms including hot flashes and night sweats.   Meds:   Current Outpatient Medications on File Prior to Visit  Medication Sig Dispense Refill   Multiple Vitamin (MULTIVITAMIN) tablet Take 1 tablet by mouth daily.     FLUZONE HIGH-DOSE 0.5 ML injection  (Patient not taking: Reported on 07/24/2023)     SPIKEVAX syringe  (Patient not taking: Reported on 07/24/2023)     triamcinolone  cream (KENALOG ) 0.1 % Apply 1 application topically 2 (two) times daily as needed. (Patient not taking: Reported on 07/24/2023) 60 each 1   No current facility-administered medications on file prior to visit.      Objective:     Vitals:   07/24/23 1121  BP: 127/81  Pulse: 70   Filed Weights   07/24/23 1121  Weight: 185 lb 8 oz (84.1 kg)              Physical examination   Pelvic:   Vulva: Normal appearance.  No lesions.  Vagina: No lesions or abnormalities noted.  Support: Normal pelvic support.  Urethra No masses tenderness or scarring.  Meatus Normal size without lesions or prolapse.  Cervix: Normal appearance.  No lesions.  Anus: Normal exam.  No lesions.  Perineum: Normal exam.  No lesions.        Bimanual   Uterus: Normal size.  Non-tender.  Mobile.  AV.  Adnexae: No masses.  Non-tender to palpation.  Cul-de-sac: Negative for abnormality.   Endometrial Biopsy After discussion with the patient regarding her abnormal uterine  bleeding I recommended that she proceed with an endometrial biopsy for further diagnosis. The risks, benefits, alternatives, and indications for an endometrial biopsy were discussed with the patient in detail. She understood the risks including infection, bleeding, cervical laceration and uterine perforation.  Verbal consent was obtained.   PROCEDURE NOTE:  Vacurette endometrial biopsy was performed using aseptic technique with iodine preparation.  The uterus was sounded to a length of 9 cm.  Adequate sampling was obtained with minimal blood loss.  The patient tolerated the procedure well.  Disposition will be pending pathology           Assessment:    G5P5 Patient Active Problem List   Diagnosis Date Noted   Postmenopausal vaginal bleeding 07/10/2023   Osteoporosis    Advance directive discussed with patient 09/27/2016   Routine general medical examination at a health care facility 09/10/2011   Hyperlipemia 11/13/2007     1. Establishing care with new doctor, encounter for   2. PMB (postmenopausal bleeding)   3. Thickened endometrium   4. Fibroids, intramural        Plan:            1.  Await biopsy results.  Discussed management at that time. Orders No orders of the defined types were placed in this encounter.   No orders of the defined types were placed in this encounter.     F/U  Return in about 2 weeks (around 08/07/2023).  Alm DOROTHA Sar, M.D. 07/24/2023 12:04 PM

## 2023-07-24 NOTE — Progress Notes (Signed)
 Patient presents today due to PMB. She states in June experiencing one week of moderate bleeding with slight cramping, has since resolved. Recent ultrasound showing fibroids and thickened endometrium. No additional concerns.

## 2023-07-28 LAB — SURGICAL PATHOLOGY

## 2023-07-30 ENCOUNTER — Ambulatory Visit: Payer: Self-pay

## 2023-08-07 ENCOUNTER — Encounter: Payer: Self-pay | Admitting: Obstetrics and Gynecology

## 2023-08-07 ENCOUNTER — Telehealth: Admitting: Obstetrics and Gynecology

## 2023-08-07 DIAGNOSIS — N939 Abnormal uterine and vaginal bleeding, unspecified: Secondary | ICD-10-CM | POA: Diagnosis not present

## 2023-08-07 DIAGNOSIS — N840 Polyp of corpus uteri: Secondary | ICD-10-CM | POA: Diagnosis not present

## 2023-08-07 NOTE — Progress Notes (Signed)
 Virtual Visit via Video Note  I connected with [name] on 08/07/23 at  7:35 AM EDT by video and verified that I was speaking with the correct person using two identifiers.    Ms. KRISTELLE CAVALLARO is a 73 y.o. G5P5 who LMP was No LMP recorded. Patient is postmenopausal. I discussed the limitations, risks, security and privacy concerns of performing an evaluation and management service by video and the availability of in person appointments. I also discussed with the patient that there may be a patient responsible charge related to this service. The patient expressed understanding and agreed to proceed.  Location of patient:  Home  Patient gave explicit verbal consent for video visit:  YES  Location of provider:  [facility] office  Persons other than physician and patient involved in provider conference:  None   Subjective:   History of Present Illness:    Patient has a history of postmenopausal bleeding.  Ultrasound revealed uterine fibroids and a thickened endometrium.  She underwent an endometrial biopsy.  She presents today to discuss that biopsy and make a plan.  Hx: The following portions of the patient's history were reviewed and updated as appropriate:             She  has a past medical history of Anxiety, ASCUS favor benign (8/13), Hyperlipidemia, Osteoporosis, and Ovarian cyst (1970's). She does not have any pertinent problems on file. She  has a past surgical history that includes Vaginal delivery; Tubal ligation; Breast lumpectomy (1986); and Breast biopsy (6/13). Her family history includes Arthritis in her brother; Asthma in her brother; Heart disease in her maternal aunt; Multiple sclerosis in her mother. She  reports that she has never smoked. She has been exposed to tobacco smoke. She has never used smokeless tobacco. She reports that she does not currently use alcohol. She reports that she does not use drugs. She has a current medication list which includes the following  prescription(s): fluzone high-dose, multivitamin, spikevax, and triamcinolone  cream. She is allergic to penicillins and sulfa antibiotics.       Review of Systems:  Review of Systems  Constitutional: Denied constitutional symptoms, night sweats, recent illness, fatigue, fever, insomnia and weight loss.  Eyes: Denied eye symptoms, eye pain, photophobia, vision change and visual disturbance.  Ears/Nose/Throat/Neck: Denied ear, nose, throat or neck symptoms, hearing loss, nasal discharge, sinus congestion and sore throat.  Cardiovascular: Denied cardiovascular symptoms, arrhythmia, chest pain/pressure, edema, exercise intolerance, orthopnea and palpitations.  Respiratory: Denied pulmonary symptoms, asthma, pleuritic pain, productive sputum, cough, dyspnea and wheezing.  Gastrointestinal: Denied, gastro-esophageal reflux, melena, nausea and vomiting.  Genitourinary: Denied genitourinary symptoms including symptomatic vaginal discharge, pelvic relaxation issues, and urinary complaints.  Musculoskeletal: Denied musculoskeletal symptoms, stiffness, swelling, muscle weakness and myalgia.  Dermatologic: Denied dermatology symptoms, rash and scar.  Neurologic: Denied neurology symptoms, dizziness, headache, neck pain and syncope.  Psychiatric: Denied psychiatric symptoms, anxiety and depression.  Endocrine: Denied endocrine symptoms including hot flashes and night sweats.   Meds:   Current Outpatient Medications on File Prior to Visit  Medication Sig Dispense Refill   FLUZONE HIGH-DOSE 0.5 ML injection  (Patient not taking: Reported on 07/24/2023)     Multiple Vitamin (MULTIVITAMIN) tablet Take 1 tablet by mouth daily.     SPIKEVAX syringe  (Patient not taking: Reported on 07/24/2023)     triamcinolone  cream (KENALOG ) 0.1 % Apply 1 application topically 2 (two) times daily as needed. (Patient not taking: Reported on 07/24/2023) 60 each 1  No current facility-administered medications on file prior to  visit.    Assessment:    G5P5 Patient Active Problem List   Diagnosis Date Noted   Postmenopausal vaginal bleeding 07/10/2023   Osteoporosis    Advance directive discussed with patient 09/27/2016   Routine general medical examination at a health care facility 09/10/2011   Hyperlipemia 11/13/2007     1. Abnormal uterine bleeding due to endometrial polyp       Plan:            1.  We have discussed the benign nature of her endometrial polyp.  I suggested that a D&C is the best management option and the patient is happy to hear this.  I believe she wanted a D&C.  She will present for a preop appointment to schedule a hysteroscopy D&C.  Orders No orders of the defined types were placed in this encounter.   No orders of the defined types were placed in this encounter.     F/U  Return in about 2 weeks (around 08/21/2023).   Alm DOROTHA Sar, M.D. 08/07/2023 7:55 AM

## 2023-08-18 ENCOUNTER — Encounter: Admitting: Internal Medicine

## 2023-09-18 ENCOUNTER — Ambulatory Visit (INDEPENDENT_AMBULATORY_CARE_PROVIDER_SITE_OTHER)

## 2023-09-18 VITALS — BP 127/81 | Ht 66.0 in | Wt 175.0 lb

## 2023-09-18 DIAGNOSIS — Z0000 Encounter for general adult medical examination without abnormal findings: Secondary | ICD-10-CM | POA: Diagnosis not present

## 2023-09-19 ENCOUNTER — Telehealth: Payer: Self-pay | Admitting: Obstetrics

## 2023-09-19 DIAGNOSIS — M1711 Unilateral primary osteoarthritis, right knee: Secondary | ICD-10-CM | POA: Diagnosis not present

## 2023-09-19 NOTE — Progress Notes (Signed)
 Because this visit was a virtual/telehealth visit,  certain criteria was not obtained, such a blood pressure, CBG if applicable, and timed get up and go. Any medications not marked as taking were not mentioned during the medication reconciliation part of the visit. Any vitals not documented were not able to be obtained due to this being a telehealth visit or patient was unable to self-report a recent blood pressure reading due to a lack of equipment at home via telehealth. Vitals that have been documented are verbally provided by the patient.  This visit was performed by a medical professional under my direct supervision. I was immediately available for consultation/collaboration. I have reviewed and agree with the Annual Wellness Visit documentation.  Subjective:   Charlotte Wright is a 73 y.o. who presents for a Medicare Wellness preventive visit.  As a reminder, Annual Wellness Visits don't include a physical exam, and some assessments may be limited, especially if this visit is performed virtually. We may recommend an in-person follow-up visit with your provider if needed.  Visit Complete: Virtual I connected with  [name] on 09/19/23 by a audio enabled telemedicine application and verified that I am speaking with the correct person using two identifiers.  Patient Location: Home  Provider Location: Home Office  I discussed the limitations of evaluation and management by telemedicine. The patient expressed understanding and agreed to proceed.  Vital Signs: Because this visit was a virtual/telehealth visit, some criteria may be missing or patient reported. Any vitals not documented were not able to be obtained and vitals that have been documented are patient reported.  VideoDeclined- This patient declined Librarian, academic. Therefore the visit was completed with audio only.  Persons Participating in Visit: Patient.  AWV Questionnaire: No: Patient Medicare AWV  questionnaire was not completed prior to this visit.  Cardiac Risk Factors include: advanced age (>49men, >51 women);dyslipidemia     Objective:    Today's Vitals   09/18/23 1516  BP: 127/81  Weight: 175 lb (79.4 kg)  Height: 5' 6 (1.676 m)   Body mass index is 28.25 kg/m.     09/18/2023    3:15 PM  Advanced Directives  Does Patient Have a Medical Advance Directive? No  Would patient like information on creating a medical advance directive? No - Patient declined    Current Medications (verified) Outpatient Encounter Medications as of 09/18/2023  Medication Sig   BOOSTRIX 5-2.5-18.5 LF-MCG/0.5 injection Inject 0.5 mLs into the muscle once.   FLUZONE HIGH-DOSE 0.5 ML injection    meloxicam (MOBIC) 15 MG tablet Take 15 mg by mouth daily.   Multiple Vitamin (MULTIVITAMIN) tablet Take 1 tablet by mouth daily.   PREVNAR 20 0.5 ML injection Inject 0.5 mLs into the muscle once.   SPIKEVAX syringe    triamcinolone  cream (KENALOG ) 0.1 % Apply 1 application topically 2 (two) times daily as needed. (Patient not taking: Reported on 07/24/2023)   No facility-administered encounter medications on file as of 09/18/2023.    Allergies (verified) Penicillins and Sulfa antibiotics   History: Past Medical History:  Diagnosis Date   Anxiety    ASCUS favor benign 8/13   Hyperlipidemia    Osteoporosis    osteopenia   Ovarian cyst 1970's   right   Past Surgical History:  Procedure Laterality Date   BREAST BIOPSY  6/13   negative   BREAST LUMPECTOMY  1986   right   TUBAL LIGATION     VAGINAL DELIVERY  x5   Family History  Problem Relation Age of Onset   Multiple sclerosis Mother    Heart disease Maternal Aunt    Arthritis Brother        multiple back surgeries   Asthma Brother    Colon cancer Neg Hx    Colon polyps Neg Hx    Esophageal cancer Neg Hx    Stomach cancer Neg Hx    Rectal cancer Neg Hx    Breast cancer Neg Hx    Social History   Socioeconomic History    Marital status: Married    Spouse name: Not on file   Number of children: 5   Years of education: Not on file   Highest education level: Master's degree (e.g., MA, MS, MEng, MEd, MSW, MBA)  Occupational History   Occupation: Retail buyer --retired  Tobacco Use   Smoking status: Never    Passive exposure: Past   Smokeless tobacco: Never  Vaping Use   Vaping status: Never Used  Substance and Sexual Activity   Alcohol use: Not Currently    Comment: quit, 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   Finished Master's in English      Has living will   Husband, then daughter Chiquita, would be health care POA   Would accept resuscitation but no prolonged ventilation   No tube feeds if cognitively unaware   Social Drivers of Health   Financial Resource Strain: Low Risk  (09/18/2023)   Overall Financial Resource Strain (CARDIA)    Difficulty of Paying Living Expenses: Not hard at all  Food Insecurity: No Food Insecurity (09/18/2023)   Hunger Vital Sign    Worried About Running Out of Food in the Last Year: Never true    Ran Out of Food in the Last Year: Never true  Transportation Needs: No Transportation Needs (09/18/2023)   PRAPARE - Administrator, Civil Service (Medical): No    Lack of Transportation (Non-Medical): No  Physical Activity: Sufficiently Active (09/18/2023)   Exercise Vital Sign    Days of Exercise per Week: 5 days    Minutes of Exercise per Session: 60 min  Stress: No Stress Concern Present (09/18/2023)   [employer] of Occupational Health - Occupational Stress Questionnaire    Feeling of Stress: Only a little  Social Connections: Socially Integrated (09/18/2023)   Social Connection and Isolation Panel    Frequency of Communication with Friends and  Family: Three times a week    Frequency of Social Gatherings with Friends and Family: Once a week    Attends Religious Services: 1 to 4 times per year    Active Member of [employer] or Organizations: Yes    Attends Banker Meetings: 1 to 4 times per year    Marital Status: Married    Tobacco Counseling Counseling given: Not Answered    Clinical Intake:  Pre-visit preparation completed: Yes  Pain : No/denies pain     BMI - recorded: 28.25 Nutritional Status: BMI 25 -29 Overweight Nutritional Risks: None Diabetes: No  No results found for: HGBA1C   How often do you need to have someone help you when you read instructions, pamphlets, or other written materials from your doctor or pharmacy?: 1 - Never  Interpreter Needed?:  No  Information entered by :: Catilyn Boggus,CMA   Activities of Daily Living     09/18/2023    2:47 PM  In your present state of health, do you have any difficulty performing the following activities:  Hearing? 0  Vision? 0  Difficulty concentrating or making decisions? 0  Walking or climbing stairs? 1  Dressing or bathing? 0  Doing errands, shopping? 0  Preparing Food and eating ? N  Using the Toilet? N  In the past six months, have you accidently leaked urine? N  Do you have problems with loss of bowel control? N  Managing your Medications? N  Managing your Finances? N  Housekeeping or managing your Housekeeping? N    Patient Care Team: Jimmy Charlie FERNS, MD as PCP - General  I have updated your Care Teams any recent Medical Services you may have received from other providers in the past year.     Assessment:   This is a routine wellness examination for Tichina.  Hearing/Vision screen Hearing Screening - Comments:: No difficulties  Vision Screening - Comments:: Patient wears glasses    Goals Addressed             This Visit's Progress    Patient Stated       Patient would like to lose weight        Depression  Screen     09/18/2023    3:20 PM 07/10/2023    2:01 PM 05/22/2021    9:50 AM 05/15/2018    9:41 AM 09/27/2016    9:25 AM  PHQ 2/9 Scores  PHQ - 2 Score 0 0 0 0 0  PHQ- 9 Score 0        Fall Risk     09/18/2023    2:47 PM 07/10/2023    2:01 PM 05/22/2021    9:50 AM 05/15/2018    9:41 AM 09/27/2016   10:15 AM  Fall Risk   Falls in the past year? 0 0 0 0  No   Number falls in past yr: 0 0     Injury with Fall? 0 0     Risk for fall due to : No Fall Risks No Fall Risks     Follow up Falls evaluation completed Falls evaluation completed        Data saved with a previous flowsheet row definition    MEDICARE RISK AT HOME:  Medicare Risk at Home Any stairs in or around the home?: (Patient-Rptd) Yes If so, are there any without handrails?: (Patient-Rptd) No Home free of loose throw rugs in walkways, pet beds, electrical cords, etc?: (Patient-Rptd) Yes Adequate lighting in your home to reduce risk of falls?: (Patient-Rptd) Yes Life alert?: (Patient-Rptd) No Use of a cane, walker or w/c?: (Patient-Rptd) No Grab bars in the bathroom?: (Patient-Rptd) Yes Shower chair or bench in shower?: (Patient-Rptd) Yes Elevated toilet seat or a handicapped toilet?: (Patient-Rptd) Yes  TIMED UP AND GO:  Was the test performed?  No  Cognitive Function: 6CIT completed        09/18/2023    3:21 PM  6CIT Screen  What Year? 0 points  What month? 0 points  What time? 0 points  Count back from 20 0 points  Months in reverse 0 points  Repeat phrase 0 points  Total Score 0 points    Immunizations Immunization History  Administered Date(s) Administered   Fluad Quad(high Dose 65+) 11/16/2021   Hepatitis A 07/21/2008   Hepatitis B, ADULT  07/21/2008, 12/15/2008   INFLUENZA, HIGH DOSE SEASONAL PF 09/23/2022   Influenza Whole 10/14/2007   Influenza, Seasonal, Injecte, Preservative Fre 11/10/2017   Influenza,inj,Quad PF,6+ Mos 09/13/2013, 09/20/2014, 09/22/2015, 09/27/2016   Influenza-Unspecified  09/13/2018, 09/15/2019, 09/27/2020   Moderna Covid-19 Fall Seasonal Vaccine 30yrs & older 11/16/2021, 07/06/2023   PFIZER Comirnaty(Gray Top)Covid-19 Tri-Sucrose Vaccine 04/18/2020   PFIZER(Purple Top)SARS-COV-2 Vaccination 02/02/2019, 02/23/2019, 10/12/2019, 04/18/2020   PNEUMOCOCCAL CONJUGATE-20 07/06/2023   Pfizer Covid-19 Vaccine Bivalent Booster 76yrs & up 09/27/2020   Pneumococcal Conjugate-13 05/21/2018   Pneumococcal Polysaccharide-23 05/18/2019   Respiratory Syncytial Virus Vaccine,Recomb Aduvanted(Arexvy) 11/16/2021   Td 09/01/2001   Tdap 09/10/2011, 07/06/2023   Typhoid Live 07/21/2008   Unspecified SARS-COV-2 Vaccination 07/06/2023   Yellow Fever 07/21/2008   Zoster Recombinant(Shingrix) 07/09/2018, 09/23/2018   Zoster, Live 09/11/2012    Screening Tests Health Maintenance  Topic Date Due   Influenza Vaccine  08/15/2023   COVID-19 Vaccine (9 - 2024-25 season) 01/05/2024   Medicare Annual Wellness (AWV)  09/17/2024   MAMMOGRAM  03/20/2025   Colonoscopy  11/18/2028   DTaP/Tdap/Td (4 - Td or Tdap) 07/05/2033   Pneumococcal Vaccine: 50+ Years  Completed   DEXA SCAN  Completed   Hepatitis C Screening  Completed   Zoster Vaccines- Shingrix  Completed   HPV VACCINES  Aged Out   Meningococcal B Vaccine  Aged Out   Hepatitis B Vaccines 19-59 Average Risk  Discontinued    Health Maintenance  Health Maintenance Due  Topic Date Due   Influenza Vaccine  08/15/2023   Health Maintenance Items Addressed:   Additional Screening:  Vision Screening: Recommended annual ophthalmology exams for early detection of glaucoma and other disorders of the eye. Would you like a referral to an eye doctor? No    Dental Screening: Recommended annual dental exams for proper oral hygiene  Community Resource Referral / Chronic Care Management: CRR required this visit?  No   CCM required this visit?  No   Plan:    I have personally reviewed and noted the following in the patient's  chart:   Medical and social history Use of alcohol, tobacco or illicit drugs  Current medications and supplements including opioid prescriptions. Patient is not currently taking opioid prescriptions. Functional ability and status Nutritional status Physical activity Advanced directives List of other physicians Hospitalizations, surgeries, and ER visits in previous 12 months Vitals Screenings to include cognitive, depression, and falls Referrals and appointments  In addition, I have reviewed and discussed with patient certain preventive protocols, quality metrics, and best practice recommendations. A written personalized care plan for preventive services as well as general preventive health recommendations were provided to patient.   Lyle MARLA Right, [place]   09/19/2023   After Visit Summary: (MyChart) Due to this being a telephonic visit, the after visit summary with patients personalized plan was offered to patient via MyChart   Notes: Nothing significant to report at this time.

## 2023-09-19 NOTE — Patient Instructions (Signed)
 Charlotte Wright,  Thank you for taking the time for your Medicare Wellness Visit. I appreciate your continued commitment to your health goals. Please review the care plan we discussed, and feel free to reach out if I can assist you further.  Medicare recommends these wellness visits once per year to help you and your care team stay ahead of potential health issues. These visits are designed to focus on prevention, allowing your provider to concentrate on managing your acute and chronic conditions during your regular appointments.  Please note that Annual Wellness Visits do not include a physical exam. Some assessments may be limited, especially if the visit was conducted virtually. If needed, we may recommend a separate in-person follow-up with your provider.  Ongoing Care Seeing your primary care provider every 3 to 6 months helps us  monitor your health and provide consistent, personalized care.   Referrals If a referral was made during today's visit and you haven't received any updates within two weeks, please contact the referred provider directly to check on the status.  Recommended Screenings:  Health Maintenance  Topic Date Due   Flu Shot  08/15/2023   COVID-19 Vaccine (9 - 2024-25 season) 01/05/2024   Medicare Annual Wellness Visit  09/17/2024   Mammogram  03/20/2025   Colon Cancer Screening  11/18/2028   DTaP/Tdap/Td vaccine (4 - Td or Tdap) 07/05/2033   Pneumococcal Vaccine for age over 43  Completed   DEXA scan (bone density measurement)  Completed   Hepatitis C Screening  Completed   Zoster (Shingles) Vaccine  Completed   HPV Vaccine  Aged Out   Meningitis B Vaccine  Aged Out   Hepatitis B Vaccine  Discontinued       09/18/2023    3:15 PM  Advanced Directives  Does Patient Have a Medical Advance Directive? No  Would patient like information on creating a medical advance directive? No - Patient declined   Advance Care Planning is important because it: Ensures you receive  medical care that aligns with your values, goals, and preferences. Provides guidance to your family and loved ones, reducing the emotional burden of decision-making during critical moments.  Vision: Annual vision screenings are recommended for early detection of glaucoma, cataracts, and diabetic retinopathy. These exams can also reveal signs of chronic conditions such as diabetes and high blood pressure.  Dental: Annual dental screenings help detect early signs of oral cancer, gum disease, and other conditions linked to overall health, including heart disease and diabetes.  Please see the attached documents for additional preventive care recommendations.

## 2023-09-19 NOTE — Telephone Encounter (Signed)
 Patient aware of appt cancellation and has been rescheduled to see Dr. Leigh.

## 2023-09-25 ENCOUNTER — Encounter: Admitting: Obstetrics and Gynecology

## 2023-10-13 NOTE — H&P (View-Only) (Signed)
 GYNECOLOGY PREOPERATIVE HISTORY AND PHYSICAL   Subjective:  Charlotte Wright is a 73 y.o. G5P5 here for surgical management of post-menopausal bleeding and thickened endometrium with a benign endometrial polyp.   Indications for procedure include: postmenopausal bleeding and thickened endometrium. No significant preoperative concerns.  She reports no more bleeding since the week long bleed she had in June '25.   Proposed surgery: Hysteroscopy, D&C  Pertinent Gynecological History: Menses: post-menopausal Bleeding: post menopausal bleeding Contraception: post menopausal status Last mammogram: normal Date: 04/08/23 Last pap: normal Date: 09/22/2015  TVUS 07/11/23 FINDINGS: Uterus Measurements: 4.8 x 6.9 x 6.9 cm = volume: 119.2 mL. There are at least 2 intramural leiomyomas with largest measuring up to 3.2 x 4.1 x 4.3 cm.   Endometrium Thickness: 12.9 mm. There is cystic appearance of the endometrium. Findings favor endometrial hyperplasia however, tissue sampling is recommended as endometrial carcinoma can also have similar appearance.   Right ovary Right ovary is not distinctly visualized.  No adnexal mass seen.   Left ovary Left ovary is not distinctly visualized.  No adnexal mass seen.   Other findings No abnormal free fluid.   IMPRESSION: 1. Thickened endometrium with cystic appearance. Findings favor endometrial hyperplasia however, tissue sampling is recommended as endometrial carcinoma can also have similar appearance. 2. Leiomyomatous uterus. 3. Bilateral ovaries are not distinctly visualized. No adnexal mass seen.  Pathology 07/24/23 FINAL MICROSCOPIC DIAGNOSIS:  A. ENDOMETRIUM, BIOPSY:  Inactive endometrium.  Benign endometrial polyp  Negative for endometrial intraepithelial neoplasia (EIN) and malignancy    Past Medical History:  Diagnosis Date   Anxiety    ASCUS favor benign 8/13   Hyperlipidemia    Osteoporosis    osteopenia   Ovarian cyst 1970's    right   Past Surgical History:  Procedure Laterality Date   BREAST BIOPSY  6/13   negative   BREAST LUMPECTOMY  1986   right   TUBAL LIGATION     VAGINAL DELIVERY     x5   OB History  Gravida Para Term Preterm AB Living  5 5      SAB IAB Ectopic Multiple Live Births          # Outcome Date GA Lbr Len/2nd Weight Sex Type Anes PTL Lv  5 Para           4 Para           3 Para           2 Para           1 Para           Patient denies any other pertinent gynecologic issues.  Family History  Problem Relation Age of Onset   Multiple sclerosis Mother    Heart disease Maternal Aunt    Arthritis Brother        multiple back surgeries   Asthma Brother    Colon cancer Neg Hx    Colon polyps Neg Hx    Esophageal cancer Neg Hx    Stomach cancer Neg Hx    Rectal cancer Neg Hx    Breast cancer Neg Hx    Social History   Socioeconomic History   Marital status: Married    Spouse name: Not on file   Number of children: 5   Years of education: Not on file   Highest education level: Master's degree (e.g., MA, MS, MEng, MEd, MSW, MBA)  Occupational History   Occupation: Retail buyer --retired  Tobacco  Use   Smoking status: Never    Passive exposure: Past   Smokeless tobacco: Never  Vaping Use   Vaping status: Never Used  Substance and Sexual Activity   Alcohol use: Not Currently    Comment: quit, 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   Finished Master's in English      Has living will   Husband, then daughter Chiquita, would be health care POA   Would accept resuscitation but no prolonged ventilation   No tube feeds if cognitively unaware   Social Drivers of Health   Financial Resource Strain: Low Risk  (09/18/2023)   Overall Financial Resource Strain  (CARDIA)    Difficulty of Paying Living Expenses: Not hard at all  Food Insecurity: No Food Insecurity (09/18/2023)   Hunger Vital Sign    Worried About Running Out of Food in the Last Year: Never true    Ran Out of Food in the Last Year: Never true  Transportation Needs: No Transportation Needs (09/18/2023)   PRAPARE - Administrator, Civil Service (Medical): No    Lack of Transportation (Non-Medical): No  Physical Activity: Sufficiently Active (09/18/2023)   Exercise Vital Sign    Days of Exercise per Week: 5 days    Minutes of Exercise per Session: 60 min  Stress: No Stress Concern Present (09/18/2023)   [employer] of Occupational Health - Occupational Stress Questionnaire    Feeling of Stress: Only a little  Social Connections: Socially Integrated (09/18/2023)   Social Connection and Isolation Panel    Frequency of Communication with Friends and Family: Three times a week    Frequency of Social Gatherings with Friends and Family: Once a week    Attends Religious Services: 1 to 4 times per year    Active Member of [employer] or Organizations: Yes    Attends Banker Meetings: 1 to 4 times per year    Marital Status: Married  Catering manager Violence: Not At Risk (09/18/2023)   Humiliation, Afraid, Rape, and Kick questionnaire    Fear of Current or Ex-Partner: No    Emotionally Abused: No    Physically Abused: No    Sexually Abused: No   Current Outpatient Medications on File Prior to Visit  Medication Sig Dispense Refill   meloxicam (MOBIC) 15 MG tablet Take 15 mg by mouth daily.     Multiple Vitamin (MULTIVITAMIN) tablet Take 1 tablet by mouth daily.     BOOSTRIX 5-2.5-18.5 LF-MCG/0.5 injection Inject 0.5 mLs into the muscle once. (Patient not taking: Reported on 10/14/2023)     FLUZONE HIGH-DOSE 0.5 ML injection  (Patient not taking: Reported on 10/14/2023)     PREVNAR 20 0.5 ML injection Inject 0.5 mLs into the muscle once. (Patient not taking: Reported on  10/14/2023)     SPIKEVAX syringe  (Patient not taking: Reported on 10/14/2023)     triamcinolone  cream (KENALOG ) 0.1 % Apply 1 application topically 2 (two) times daily as needed. (Patient not taking: Reported on 07/24/2023) 60 each 1   No current facility-administered medications on file prior to visit.   Allergies  Allergen Reactions   Penicillins     REACTION: rash   Sulfa Antibiotics Rash   Review of Systems Constitutional: No recent fever/chills/sweats Respiratory:  No recent cough/bronchitis Cardiovascular: No chest pain Gastrointestinal: No recent nausea/vomiting/diarrhea Genitourinary: No UTI symptoms Hematologic/lymphatic:No history of coagulopathy or recent blood thinner use    Objective:   Blood pressure 124/77, pulse 84, height 5' 6 (1.676 m), weight 183 lb 6.4 oz (83.2 kg). CONSTITUTIONAL: Well-developed, well-nourished female in no acute distress.  HENT:  Normocephalic, atraumatic, External right and left ear normal. Oropharynx is clear and moist EYES: Conjunctivae and EOM are normal. Pupils are equal, round, and reactive to light. No scleral icterus.  NECK: Normal range of motion, supple, no masses SKIN: Skin is warm and dry. No rash noted. Not diaphoretic. No erythema. No pallor. NEUROLOGIC: Alert and oriented to person, place, and time. Normal reflexes, muscle tone coordination. No cranial nerve deficit noted. PSYCHIATRIC: Normal mood and affect. Normal behavior. Normal judgment and thought content. CARDIOVASCULAR: Normal heart rate noted, regular rhythm RESPIRATORY: Effort and breath sounds normal, no problems with respiration noted ABDOMEN: Soft, nontender, nondistended. PELVIC: Deferred MUSCULOSKELETAL: Normal range of motion. No edema and no tenderness. 2+ distal pulses.  Labs: No results found for this or any previous visit (from the past 2 weeks).   Imaging Studies: No results found.  Assessment:     73 y.o. G5P5 with PMB, thickened EMT 12mm, with a  negative EMB, and known fibroid uterus and a benign endometrial polyp. We reviewed Dr. Janit' plan for hysteroscopy D&C versus  medical therapy and pt opts for D&C.   Plan:   Counseling: Procedure, risks, reasons, benefits and complications (including injury to bowel, bladder, major blood vessel, ureter, bleeding, possibility of transfusion, infection, or fistula formation) reviewed in detail. Likelihood of success in alleviating the patient's condition was discussed. Routine postoperative instructions will be reviewed with the patient and her family in detail after surgery.  The patient concurred with the proposed plan. Pt requesting 10/27/23 Preop testing ordered Instructions reviewed, including NPO after midnight. Plan for 2-3wk post-op visit   Estil Mangle, DO [facility]

## 2023-10-13 NOTE — Progress Notes (Unsigned)
 GYNECOLOGY PREOPERATIVE HISTORY AND PHYSICAL   Subjective:  Charlotte Wright is a 73 y.o. G5P5 here for surgical management of post-menopausal bleeding and thickened endometrium with a benign endometrial polyp.   Indications for procedure include: postmenopausal bleeding and thickened endometrium. No significant preoperative concerns.  She reports no more bleeding since the week long bleed she had in June '25.   Proposed surgery: Hysteroscopy, D&C  Pertinent Gynecological History: Menses: post-menopausal Bleeding: post menopausal bleeding Contraception: post menopausal status Last mammogram: normal Date: 04/08/23 Last pap: normal Date: 09/22/2015  TVUS 07/11/23 FINDINGS: Uterus Measurements: 4.8 x 6.9 x 6.9 cm = volume: 119.2 mL. There are at least 2 intramural leiomyomas with largest measuring up to 3.2 x 4.1 x 4.3 cm.   Endometrium Thickness: 12.9 mm. There is cystic appearance of the endometrium. Findings favor endometrial hyperplasia however, tissue sampling is recommended as endometrial carcinoma can also have similar appearance.   Right ovary Right ovary is not distinctly visualized.  No adnexal mass seen.   Left ovary Left ovary is not distinctly visualized.  No adnexal mass seen.   Other findings No abnormal free fluid.   IMPRESSION: 1. Thickened endometrium with cystic appearance. Findings favor endometrial hyperplasia however, tissue sampling is recommended as endometrial carcinoma can also have similar appearance. 2. Leiomyomatous uterus. 3. Bilateral ovaries are not distinctly visualized. No adnexal mass seen.  Pathology 07/24/23 FINAL MICROSCOPIC DIAGNOSIS:  A. ENDOMETRIUM, BIOPSY:  Inactive endometrium.  Benign endometrial polyp  Negative for endometrial intraepithelial neoplasia (EIN) and malignancy    Past Medical History:  Diagnosis Date   Anxiety    ASCUS favor benign 8/13   Hyperlipidemia    Osteoporosis    osteopenia   Ovarian cyst 1970's    right   Past Surgical History:  Procedure Laterality Date   BREAST BIOPSY  6/13   negative   BREAST LUMPECTOMY  1986   right   TUBAL LIGATION     VAGINAL DELIVERY     x5   OB History  Gravida Para Term Preterm AB Living  5 5      SAB IAB Ectopic Multiple Live Births          # Outcome Date GA Lbr Len/2nd Weight Sex Type Anes PTL Lv  5 Para           4 Para           3 Para           2 Para           1 Para           Patient denies any other pertinent gynecologic issues.  Family History  Problem Relation Age of Onset   Multiple sclerosis Mother    Heart disease Maternal Aunt    Arthritis Brother        multiple back surgeries   Asthma Brother    Colon cancer Neg Hx    Colon polyps Neg Hx    Esophageal cancer Neg Hx    Stomach cancer Neg Hx    Rectal cancer Neg Hx    Breast cancer Neg Hx    Social History   Socioeconomic History   Marital status: Married    Spouse name: Not on file   Number of children: 5   Years of education: Not on file   Highest education level: Master's degree (e.g., MA, MS, MEng, MEd, MSW, MBA)  Occupational History   Occupation: Retail buyer --retired  Tobacco  Use   Smoking status: Never    Passive exposure: Past   Smokeless tobacco: Never  Vaping Use   Vaping status: Never Used  Substance and Sexual Activity   Alcohol use: Not Currently    Comment: quit, 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   Finished Master's in English      Has living will   Husband, then daughter Chiquita, would be health care POA   Would accept resuscitation but no prolonged ventilation   No tube feeds if cognitively unaware   Social Drivers of Health   Financial Resource Strain: Low Risk  (09/18/2023)   Overall Financial Resource Strain  (CARDIA)    Difficulty of Paying Living Expenses: Not hard at all  Food Insecurity: No Food Insecurity (09/18/2023)   Hunger Vital Sign    Worried About Running Out of Food in the Last Year: Never true    Ran Out of Food in the Last Year: Never true  Transportation Needs: No Transportation Needs (09/18/2023)   PRAPARE - Administrator, Civil Service (Medical): No    Lack of Transportation (Non-Medical): No  Physical Activity: Sufficiently Active (09/18/2023)   Exercise Vital Sign    Days of Exercise per Week: 5 days    Minutes of Exercise per Session: 60 min  Stress: No Stress Concern Present (09/18/2023)   [employer] of Occupational Health - Occupational Stress Questionnaire    Feeling of Stress: Only a little  Social Connections: Socially Integrated (09/18/2023)   Social Connection and Isolation Panel    Frequency of Communication with Friends and Family: Three times a week    Frequency of Social Gatherings with Friends and Family: Once a week    Attends Religious Services: 1 to 4 times per year    Active Member of [employer] or Organizations: Yes    Attends Banker Meetings: 1 to 4 times per year    Marital Status: Married  Catering manager Violence: Not At Risk (09/18/2023)   Humiliation, Afraid, Rape, and Kick questionnaire    Fear of Current or Ex-Partner: No    Emotionally Abused: No    Physically Abused: No    Sexually Abused: No   Current Outpatient Medications on File Prior to Visit  Medication Sig Dispense Refill   meloxicam (MOBIC) 15 MG tablet Take 15 mg by mouth daily.     Multiple Vitamin (MULTIVITAMIN) tablet Take 1 tablet by mouth daily.     BOOSTRIX 5-2.5-18.5 LF-MCG/0.5 injection Inject 0.5 mLs into the muscle once. (Patient not taking: Reported on 10/14/2023)     FLUZONE HIGH-DOSE 0.5 ML injection  (Patient not taking: Reported on 10/14/2023)     PREVNAR 20 0.5 ML injection Inject 0.5 mLs into the muscle once. (Patient not taking: Reported on  10/14/2023)     SPIKEVAX syringe  (Patient not taking: Reported on 10/14/2023)     triamcinolone  cream (KENALOG ) 0.1 % Apply 1 application topically 2 (two) times daily as needed. (Patient not taking: Reported on 07/24/2023) 60 each 1   No current facility-administered medications on file prior to visit.   Allergies  Allergen Reactions   Penicillins     REACTION: rash   Sulfa Antibiotics Rash   Review of Systems Constitutional: No recent fever/chills/sweats Respiratory:  No recent cough/bronchitis Cardiovascular: No chest pain Gastrointestinal: No recent nausea/vomiting/diarrhea Genitourinary: No UTI symptoms Hematologic/lymphatic:No history of coagulopathy or recent blood thinner use    Objective:   Blood pressure 124/77, pulse 84, height 5' 6 (1.676 m), weight 183 lb 6.4 oz (83.2 kg). CONSTITUTIONAL: Well-developed, well-nourished female in no acute distress.  HENT:  Normocephalic, atraumatic, External right and left ear normal. Oropharynx is clear and moist EYES: Conjunctivae and EOM are normal. Pupils are equal, round, and reactive to light. No scleral icterus.  NECK: Normal range of motion, supple, no masses SKIN: Skin is warm and dry. No rash noted. Not diaphoretic. No erythema. No pallor. NEUROLOGIC: Alert and oriented to person, place, and time. Normal reflexes, muscle tone coordination. No cranial nerve deficit noted. PSYCHIATRIC: Normal mood and affect. Normal behavior. Normal judgment and thought content. CARDIOVASCULAR: Normal heart rate noted, regular rhythm RESPIRATORY: Effort and breath sounds normal, no problems with respiration noted ABDOMEN: Soft, nontender, nondistended. PELVIC: Deferred MUSCULOSKELETAL: Normal range of motion. No edema and no tenderness. 2+ distal pulses.  Labs: No results found for this or any previous visit (from the past 2 weeks).   Imaging Studies: No results found.  Assessment:     73 y.o. G5P5 with PMB, thickened EMT 12mm, with a  negative EMB, and known fibroid uterus and a benign endometrial polyp. We reviewed Dr. Janit' plan for hysteroscopy D&C versus  medical therapy and pt opts for D&C.   Plan:   Counseling: Procedure, risks, reasons, benefits and complications (including injury to bowel, bladder, major blood vessel, ureter, bleeding, possibility of transfusion, infection, or fistula formation) reviewed in detail. Likelihood of success in alleviating the patient's condition was discussed. Routine postoperative instructions will be reviewed with the patient and her family in detail after surgery.  The patient concurred with the proposed plan. Pt requesting 10/27/23 Preop testing ordered Instructions reviewed, including NPO after midnight. Plan for 2-3wk post-op visit   Estil Mangle, DO [facility]

## 2023-10-14 ENCOUNTER — Ambulatory Visit (INDEPENDENT_AMBULATORY_CARE_PROVIDER_SITE_OTHER): Admitting: Obstetrics

## 2023-10-14 ENCOUNTER — Encounter: Payer: Self-pay | Admitting: Obstetrics

## 2023-10-14 VITALS — BP 124/77 | HR 84 | Ht 66.0 in | Wt 183.4 lb

## 2023-10-14 DIAGNOSIS — D259 Leiomyoma of uterus, unspecified: Secondary | ICD-10-CM | POA: Diagnosis not present

## 2023-10-14 DIAGNOSIS — Z01818 Encounter for other preprocedural examination: Secondary | ICD-10-CM

## 2023-10-14 DIAGNOSIS — N950 Postmenopausal bleeding: Secondary | ICD-10-CM | POA: Diagnosis not present

## 2023-10-14 DIAGNOSIS — R9389 Abnormal findings on diagnostic imaging of other specified body structures: Secondary | ICD-10-CM | POA: Diagnosis not present

## 2023-10-14 DIAGNOSIS — N939 Abnormal uterine and vaginal bleeding, unspecified: Secondary | ICD-10-CM | POA: Insufficient documentation

## 2023-10-14 DIAGNOSIS — D251 Intramural leiomyoma of uterus: Secondary | ICD-10-CM | POA: Insufficient documentation

## 2023-10-14 DIAGNOSIS — N840 Polyp of corpus uteri: Secondary | ICD-10-CM

## 2023-10-21 ENCOUNTER — Other Ambulatory Visit: Payer: Self-pay

## 2023-10-21 ENCOUNTER — Encounter
Admission: RE | Admit: 2023-10-21 | Discharge: 2023-10-21 | Disposition: A | Discharge status: Home / Self Care | Source: Ambulatory Visit | Attending: Obstetrics | Admitting: Obstetrics

## 2023-10-21 VITALS — Ht 66.0 in | Wt 180.0 lb

## 2023-10-21 DIAGNOSIS — N840 Polyp of corpus uteri: Secondary | ICD-10-CM

## 2023-10-21 DIAGNOSIS — N950 Postmenopausal bleeding: Secondary | ICD-10-CM

## 2023-10-21 DIAGNOSIS — E785 Hyperlipidemia, unspecified: Secondary | ICD-10-CM

## 2023-10-21 NOTE — Patient Instructions (Signed)
 Your procedure is scheduled on: Monday 10/27/23 Report to the Registration Desk on the 1st floor of the [facility]. To find out your arrival time, please call [phone] between 1PM - 3PM on: Friday 10/24/23 If your arrival time is 6:00 am, do not arrive before that time as the [facility] Mall entrance doors do not open until 6:00 am.  REMEMBER: Instructions that are not followed completely may result in serious medical risk, up to and including death; or upon the discretion of your surgeon and anesthesiologist your surgery may need to be rescheduled.  Do not eat food or drink any liquids after midnight the night before surgery.  No gum chewing or hard candies.  One week prior to surgery: Stop Anti-inflammatories (NSAIDS) such as Advil, Aleve , Ibuprofen, Motrin, Naproxen , Naprosyn  and Aspirin based products such as Excedrin, Goody's Powder, BC Powder.  You may however, continue to take Tylenol if needed for pain up until the day of surgery.  Stop ANY OVER THE COUNTER supplements and vitamins until after surgery.  Continue taking all of your other prescription medications up until the day of surgery.  ON THE DAY OF SURGERY ONLY TAKE THESE MEDICATIONS WITH SIPS OF WATER:  NONE  No Alcohol for 24 hours before or after surgery.  No Smoking including e-cigarettes for 24 hours before surgery.  No chewable tobacco products for at least 6 hours before surgery.  No nicotine patches on the day of surgery.  Do not use any recreational drugs for at least a week (preferably 2 weeks) before your surgery.  Please be advised that the combination of cocaine and anesthesia may have negative outcomes, up to and including death. If you test positive for cocaine, your surgery will be cancelled.  On the morning of surgery brush your teeth with toothpaste and water, you may rinse your mouth with mouthwash if you wish. Do not swallow any toothpaste or mouthwash.  Shower prior to arrival for your  procedure.  Do not shave body hair from the neck down 48 hours before surgery.  Do not wear lotions, powders, deodorants or perfumes.   Do not wear jewelry, make-up, hairpins, clips or nail polish.  For welded (permanent) jewelry: bracelets, anklets, waist bands, etc.  Please have this removed prior to surgery.  If it is not removed, there is a chance that hospital personnel will need to cut it off on the day of surgery.  Contact lenses, hearing aids and dentures may not be worn into surgery. Bring a case for any glasses.  Do not bring valuables to the hospital. [facility] is not responsible for any missing/lost belongings or valuables.   Notify your doctor if there is any change in your medical condition (cold, fever, infection).  Wear comfortable clothing (specific to your surgery type) to the hospital.  After surgery, you can help prevent lung complications by doing breathing exercises.  Take deep breaths and cough every 1-2 hours. Your doctor may order a device called an Incentive Spirometer to help you take deep breaths.  If you are being discharged the day of surgery, you will not be allowed to drive home. You will need a responsible individual to drive you home and stay with you for 24 hours after surgery.   Please call the Pre-admissions Testing Dept. at [phone] if you have any questions about these instructions.  Surgery Visitation Policy:  Patients having surgery or a procedure may have two visitors.  Children under the age of 71 must have an  adult with them who is not the patient.  Merchandiser, retail to address health-related social needs:  [email or web address]

## 2023-10-22 ENCOUNTER — Encounter: Payer: Self-pay | Admitting: Urgent Care

## 2023-10-22 ENCOUNTER — Encounter
Admission: RE | Admit: 2023-10-22 | Discharge: 2023-10-22 | Disposition: A | Discharge status: Home / Self Care | Source: Ambulatory Visit | Attending: Obstetrics | Admitting: Obstetrics

## 2023-10-22 DIAGNOSIS — E785 Hyperlipidemia, unspecified: Secondary | ICD-10-CM | POA: Insufficient documentation

## 2023-10-22 DIAGNOSIS — N950 Postmenopausal bleeding: Secondary | ICD-10-CM | POA: Insufficient documentation

## 2023-10-22 DIAGNOSIS — N840 Polyp of corpus uteri: Secondary | ICD-10-CM | POA: Diagnosis not present

## 2023-10-22 DIAGNOSIS — N939 Abnormal uterine and vaginal bleeding, unspecified: Secondary | ICD-10-CM | POA: Insufficient documentation

## 2023-10-22 DIAGNOSIS — Z01818 Encounter for other preprocedural examination: Secondary | ICD-10-CM | POA: Diagnosis present

## 2023-10-22 LAB — CBC
HCT: 39.9 % (ref 36.0–46.0)
Hemoglobin: 12.7 g/dL (ref 12.0–15.0)
MCH: 33.6 pg (ref 26.0–34.0)
MCHC: 31.8 g/dL (ref 30.0–36.0)
MCV: 105.6 fL — ABNORMAL HIGH (ref 80.0–100.0)
Platelets: 202 K/uL (ref 150–400)
RBC: 3.78 MIL/uL — ABNORMAL LOW (ref 3.87–5.11)
RDW: 12.6 % (ref 11.5–15.5)
WBC: 4.3 K/uL (ref 4.0–10.5)
nRBC: 0 % (ref 0.0–0.2)

## 2023-10-27 ENCOUNTER — Ambulatory Visit: Admitting: Certified Registered Nurse Anesthetist

## 2023-10-27 ENCOUNTER — Other Ambulatory Visit: Payer: Self-pay

## 2023-10-27 ENCOUNTER — Encounter: Payer: Self-pay | Admitting: Obstetrics

## 2023-10-27 ENCOUNTER — Ambulatory Visit
Admission: RE | Admit: 2023-10-27 | Discharge: 2023-10-27 | Disposition: A | Discharge status: Home / Self Care | Attending: Obstetrics | Admitting: Obstetrics

## 2023-10-27 ENCOUNTER — Encounter
Admission: RE | Disposition: A | Discharge status: Home / Self Care | Payer: Self-pay | Source: Home / Self Care | Attending: Obstetrics

## 2023-10-27 DIAGNOSIS — D251 Intramural leiomyoma of uterus: Secondary | ICD-10-CM | POA: Diagnosis present

## 2023-10-27 DIAGNOSIS — Z8742 Personal history of other diseases of the female genital tract: Secondary | ICD-10-CM

## 2023-10-27 DIAGNOSIS — R9389 Abnormal findings on diagnostic imaging of other specified body structures: Secondary | ICD-10-CM | POA: Diagnosis present

## 2023-10-27 DIAGNOSIS — M810 Age-related osteoporosis without current pathological fracture: Secondary | ICD-10-CM | POA: Insufficient documentation

## 2023-10-27 DIAGNOSIS — N840 Polyp of corpus uteri: Secondary | ICD-10-CM | POA: Insufficient documentation

## 2023-10-27 DIAGNOSIS — N950 Postmenopausal bleeding: Secondary | ICD-10-CM | POA: Diagnosis present

## 2023-10-27 DIAGNOSIS — N939 Abnormal uterine and vaginal bleeding, unspecified: Secondary | ICD-10-CM | POA: Diagnosis present

## 2023-10-27 HISTORY — PX: HYSTEROSCOPY WITH D & C: SHX1775

## 2023-10-27 SURGERY — DILATATION AND CURETTAGE /HYSTEROSCOPY
Anesthesia: General | Site: Uterus

## 2023-10-27 MED ORDER — SODIUM CHLORIDE 0.9 % IR SOLN
Status: DC | PRN
  Administered 2023-10-27: 3000 mL

## 2023-10-27 MED ORDER — EPHEDRINE SULFATE-NACL 50-0.9 MG/10ML-% IV SOSY
PREFILLED_SYRINGE | INTRAVENOUS | Status: DC | PRN
  Administered 2023-10-27: 5 mg via INTRAVENOUS

## 2023-10-27 MED ORDER — CHLORHEXIDINE GLUCONATE 0.12 % MT SOLN
15.0000 mL | Freq: Once | OROMUCOSAL | Status: AC
  Administered 2023-10-27: 15 mL via OROMUCOSAL
  Filled 2023-10-27: qty 15

## 2023-10-27 MED ORDER — 0.9 % SODIUM CHLORIDE (POUR BTL) OPTIME
TOPICAL | Status: DC | PRN
  Administered 2023-10-27: 500 mL

## 2023-10-27 MED ORDER — FENTANYL CITRATE (PF) 100 MCG/2ML IJ SOLN
INTRAMUSCULAR | Status: AC
  Filled 2023-10-27: qty 2

## 2023-10-27 MED ORDER — ORAL CARE MOUTH RINSE: 15.0000 mL | Freq: Once | OROMUCOSAL | Status: AC

## 2023-10-27 MED ORDER — CHLORHEXIDINE GLUCONATE 0.12 % MT SOLN
OROMUCOSAL | Status: AC
  Filled 2023-10-27: qty 15

## 2023-10-27 MED ORDER — MIDAZOLAM HCL 2 MG/2ML IJ SOLN
INTRAMUSCULAR | Status: DC | PRN
Start: 2023-10-27 — End: 2023-10-27
  Administered 2023-10-27: 1 mg via INTRAVENOUS

## 2023-10-27 MED ORDER — DEXAMETHASONE SOD PHOSPHATE PF 10 MG/ML IJ SOLN
INTRAMUSCULAR | Status: DC | PRN
  Administered 2023-10-27: 8 mg via INTRAVENOUS

## 2023-10-27 MED ORDER — LACTATED RINGERS IV SOLN: INTRAVENOUS | Status: DC

## 2023-10-27 MED ORDER — FENTANYL CITRATE (PF) 100 MCG/2ML IJ SOLN: 25.0000 ug | INTRAMUSCULAR | Status: DC | PRN

## 2023-10-27 MED ORDER — ONDANSETRON HCL 4 MG/2ML IJ SOLN
INTRAMUSCULAR | Status: DC | PRN
  Administered 2023-10-27: 4 mg via INTRAVENOUS

## 2023-10-27 MED ORDER — PROPOFOL 10 MG/ML IV BOLUS
INTRAVENOUS | Status: DC | PRN
  Administered 2023-10-27: 40 mg via INTRAVENOUS
  Administered 2023-10-27: 160 mg via INTRAVENOUS

## 2023-10-27 MED ORDER — FENTANYL CITRATE (PF) 100 MCG/2ML IJ SOLN
INTRAMUSCULAR | Status: DC | PRN
  Administered 2023-10-27 (×2): 25 ug via INTRAVENOUS

## 2023-10-27 MED ORDER — DROPERIDOL 2.5 MG/ML IJ SOLN
0.6250 mg | Freq: Once | INTRAMUSCULAR | Status: DC | PRN
Start: 2023-10-27 — End: 2023-10-27

## 2023-10-27 MED ORDER — KETOROLAC TROMETHAMINE 30 MG/ML IJ SOLN
INTRAMUSCULAR | Status: DC | PRN
  Administered 2023-10-27: 15 mg via INTRAVENOUS

## 2023-10-27 MED ORDER — MIDAZOLAM HCL 2 MG/2ML IJ SOLN
INTRAMUSCULAR | Status: AC
  Filled 2023-10-27: qty 2

## 2023-10-27 MED ORDER — LIDOCAINE HCL (CARDIAC) PF 100 MG/5ML IV SOSY
PREFILLED_SYRINGE | INTRAVENOUS | Status: DC | PRN
  Administered 2023-10-27: 80 mg via INTRAVENOUS

## 2023-10-27 SURGICAL SUPPLY — 24 items
BAG URINE DRAIN 2000ML AR STRL (UROLOGICAL SUPPLIES) IMPLANT
CATH URTH 16FR FL 2W BLN LF (CATHETERS) IMPLANT
DEVICE MYOSURE LITE (MISCELLANEOUS) IMPLANT
DEVICE MYOSURE REACH (MISCELLANEOUS) IMPLANT
DRSG TELFA 3X8 NADH STRL (GAUZE/BANDAGES/DRESSINGS) IMPLANT
ELECTRODE REM PT RTRN 9FT ADLT (ELECTROSURGICAL) ×1 IMPLANT
GLOVE BIO SURGEON STRL SZ 6 (GLOVE) ×1 IMPLANT
GLOVE BIOGEL PI IND STRL 6 (GLOVE) ×1 IMPLANT
GOWN STRL REUS W/ TWL LRG LVL3 (GOWN DISPOSABLE) ×2 IMPLANT
KIT PROCED FLUENT PRO FLT212S (KITS) ×1 IMPLANT
KIT TURNOVER CYSTO (KITS) ×1 IMPLANT
MANIFOLD NEPTUNE II (INSTRUMENTS) ×1 IMPLANT
NS IRRIG 500ML POUR BTL (IV SOLUTION) IMPLANT
PACK DNC HYST (MISCELLANEOUS) ×1 IMPLANT
PAD OB MATERNITY 11 LF (PERSONAL CARE ITEMS) ×1 IMPLANT
PAD PREP OB/GYN DISP 24X41 (PERSONAL CARE ITEMS) ×1 IMPLANT
SCRUB CHG 4% DYNA-HEX 4OZ (MISCELLANEOUS) ×1 IMPLANT
SEAL ROD LENS SCOPE MYOSURE (ABLATOR) ×1 IMPLANT
SET CYSTO IRRIGATION (SET/KITS/TRAYS/PACK) IMPLANT
SOL .9 NS 3000ML IRR UROMATIC (IV SOLUTION) ×1 IMPLANT
SURGILUBE 2OZ TUBE FLIPTOP (MISCELLANEOUS) ×1 IMPLANT
TRAP FLUID SMOKE EVACUATOR (MISCELLANEOUS) ×1 IMPLANT
TUBING CONNECTING 10 (TUBING) ×1 IMPLANT
WATER STERILE IRR 500ML POUR (IV SOLUTION) ×1 IMPLANT

## 2023-10-27 NOTE — Transfer of Care (Signed)
 Immediate Anesthesia Transfer of Care Note  Patient: Charlotte Wright  Procedure(s) Performed: DILATATION AND CURETTAGE /HYSTEROSCOPY (Uterus)  Patient Location: PACU  Anesthesia Type:General  Level of Consciousness: awake, alert , and oriented  Airway & Oxygen Therapy: Patient Spontanous Breathing and Patient connected to face mask oxygen  Post-op Assessment: Report given to RN and Post -op Vital signs reviewed and stable  Post vital signs: Reviewed and stable  Last Vitals:  Vitals Value Taken Time  BP 141/76 10/27/23 14:37  Temp    Pulse 59 10/27/23 14:40  Resp 15 10/27/23 14:40  SpO2 100 % 10/27/23 14:40  Vitals shown include unfiled device data.  Last Pain:  Vitals:   10/27/23 1234  TempSrc: Temporal  PainSc: 0-No pain         Complications: No notable events documented.

## 2023-10-27 NOTE — Anesthesia Preprocedure Evaluation (Signed)
 Anesthesia Evaluation  Patient identified by MRN, date of birth, ID band Patient awake    Reviewed: Allergy & Precautions, H&P , NPO status , Patient's Chart, lab work & pertinent test results, reviewed documented beta blocker date and time   Airway Mallampati: I  TM Distance: >3 FB Neck ROM: full    Dental  (+) Caps, Dental Advidsory Given   Pulmonary neg pulmonary ROS   Pulmonary exam normal breath sounds clear to auscultation       Cardiovascular Exercise Tolerance: Good negative cardio ROS Normal cardiovascular exam Rhythm:regular Rate:Normal     Neuro/Psych negative neurological ROS  negative psych ROS   GI/Hepatic negative GI ROS, Neg liver ROS,,,  Endo/Other  negative endocrine ROS    Renal/GU negative Renal ROS  negative genitourinary   Musculoskeletal   Abdominal   Peds  Hematology negative hematology ROS (+)   Anesthesia Other Findings Past Medical History: No date: Anxiety 08/2011: ASCUS favor benign No date: Hyperlipidemia No date: Osteoporosis     Comment:  osteopenia 1970's: Ovarian cyst     Comment:  right   Reproductive/Obstetrics negative OB ROS                              Anesthesia Physical Anesthesia Plan  ASA: 1  Anesthesia Plan: General   Post-op Pain Management:    Induction: Intravenous  PONV Risk Score and Plan: 3 and Ondansetron, Dexamethasone and Treatment may vary due to age or medical condition  Airway Management Planned: LMA  Additional Equipment:   Intra-op Plan:   Post-operative Plan: Extubation in OR  Informed Consent: I have reviewed the patients History and Physical, chart, labs and discussed the procedure including the risks, benefits and alternatives for the proposed anesthesia with the patient or authorized representative who has indicated his/her understanding and acceptance.     Dental Advisory Given  Plan Discussed with:  Anesthesiologist, CRNA and Surgeon  Anesthesia Plan Comments:         Anesthesia Quick Evaluation

## 2023-10-27 NOTE — Op Note (Signed)
 HYSTEROSCOPY WITH DILATION AND CURETTAGE OPERATIVE REPORT  DATE: 10/27/23  PRE-OP DIAGNOSIS: postmenopausal bleeding; thickened endometrium; endometrial polyps  POST-OP DIAGNOSIS: Same  PROCEDURE: D&C  SURGEON: Dr. Estil Mangle  ANESTHESIA: General  IVF: ;   Distension media in: normal saline , out: ; deficit 140  EBL: <83mL  UOP: 10mL  COMPLICATIONS: None  SPECIMEN: Products of conception  FINDINGS: Normal anteverted uterus with cervix undilated; uterine cavity bicornuate-appearing, polyp on right cornua, cystic endometrium on left cornua, normal appearing ostia visualized.   DRAINS: None  CONDITION: Stable to recovery room  PROCEDURE: The risks, benefits, alternatives and indications of the procedure were discussed with the patient. She voiced an understanding of the procedure and informed consent was obtained.  The patient was taken to the OR where general anesthesia was administered without difficulty. She was placed in the dorsal lithotomy position using the Allen stirrups. And exam under anesthesia revealed an anteverted uterus with the cervix undilated. The patient was then prepped and draped in the normal sterile fashion.   A weighted speculum was inserted in the posterior aspect of the vagina. A single-tooth tenaculum was used to grasp the anterior lip of the cervix. The uterus was carefully sounded to 5cm. Pratt dilators were sequentially used to dilate the cervix until the 5mm hysteroscope could be introduced under direct visualization and the uterus was distended with normal saline. The aforementioned findings were noted. The hysteroscope was then withdrawn. The uterus was curetted in a clockwise fashion until a gritty texture was noted in all aspects of the uterus. The scrapings were sent to pathology. The hysteroscope was reintroduced and uterus inflated, ensuring hemostasis. The hysteroscope was removed, the tenaculum was removed from the cervix, and  good hemostasis was noted at the puncture site.   The patient tolerated the procedure well. The instruments and sponge counts were correct times two. She was taken to the recovery room in stable condition.    Estil Mangle, DO [facility]

## 2023-10-27 NOTE — Anesthesia Procedure Notes (Signed)
 Procedure Name: LMA Insertion Date/Time: 10/27/2023 2:05 PM  Performed by: Duwayne Craven, CRNAPre-anesthesia Checklist: Patient identified, Patient being monitored, Timeout performed, Emergency Drugs available and Suction available Patient Re-evaluated:Patient Re-evaluated prior to induction Oxygen Delivery Method: Circle system utilized Preoxygenation: Pre-oxygenation with 100% oxygen Induction Type: IV induction Ventilation: Mask ventilation without difficulty LMA: LMA inserted LMA Size: 4.0 Tube type: Oral Number of attempts: 1 Placement Confirmation: positive ETCO2 and breath sounds checked- equal and bilateral Tube secured with: Tape Dental Injury: Teeth and Oropharynx as per pre-operative assessment

## 2023-10-27 NOTE — Interval H&P Note (Signed)
 History and Physical Interval Note:  10/27/2023 12:40 PM  Charlotte Wright  has presented today for surgery, with the diagnosis of post-menopausal bleeding, thickened endometrium.  The various methods of treatment have been discussed with the patient and family. After consideration of risks, benefits and other options for treatment, the patient has consented to  Procedure(s): DILATATION AND CURETTAGE /HYSTEROSCOPY (N/A) as a surgical intervention.  The patient's history has been reviewed, patient examined, no change in status, stable for surgery.  I have reviewed the patient's chart and labs.  Questions were answered to the patient's satisfaction.     Daltyn Degroat

## 2023-10-28 ENCOUNTER — Encounter: Payer: Self-pay | Admitting: Obstetrics

## 2023-10-28 LAB — SURGICAL PATHOLOGY

## 2023-11-01 NOTE — Anesthesia Postprocedure Evaluation (Signed)
 Anesthesia Post Note  Patient: Navea B Blandino  Procedure(s) Performed: DILATATION AND CURETTAGE /HYSTEROSCOPY (Uterus)  Patient location during evaluation: PACU Anesthesia Type: General Level of consciousness: awake and alert Pain management: pain level controlled Vital Signs Assessment: post-procedure vital signs reviewed and stable Respiratory status: spontaneous breathing, nonlabored ventilation, respiratory function stable and patient connected to nasal cannula oxygen Cardiovascular status: blood pressure returned to baseline and stable Postop Assessment: no apparent nausea or vomiting Anesthetic complications: no   No notable events documented.   Last Vitals:  Vitals:   10/27/23 1520 10/27/23 1527  BP:  (!) 143/72  Pulse: (!) 58 62  Resp:  16  Temp:  36.6 C  SpO2: 100% 96%    Last Pain:  Vitals:   10/27/23 1527  TempSrc: Temporal  PainSc: 0-No pain                 Prentice Murphy

## 2023-11-06 ENCOUNTER — Emergency Department

## 2023-11-06 ENCOUNTER — Emergency Department
Admission: EM | Admit: 2023-11-06 | Discharge: 2023-11-06 | Disposition: A | Discharge status: Home / Self Care | Attending: Emergency Medicine | Admitting: Emergency Medicine

## 2023-11-06 ENCOUNTER — Other Ambulatory Visit: Payer: Self-pay

## 2023-11-06 ENCOUNTER — Encounter: Payer: Self-pay | Admitting: Emergency Medicine

## 2023-11-06 DIAGNOSIS — W109XXA Fall (on) (from) unspecified stairs and steps, initial encounter: Secondary | ICD-10-CM | POA: Diagnosis not present

## 2023-11-06 DIAGNOSIS — S9002XA Contusion of left ankle, initial encounter: Secondary | ICD-10-CM | POA: Diagnosis not present

## 2023-11-06 DIAGNOSIS — S99912A Unspecified injury of left ankle, initial encounter: Secondary | ICD-10-CM | POA: Diagnosis present

## 2023-11-06 DIAGNOSIS — M25572 Pain in left ankle and joints of left foot: Secondary | ICD-10-CM

## 2023-11-06 DIAGNOSIS — S80811A Abrasion, right lower leg, initial encounter: Secondary | ICD-10-CM | POA: Diagnosis not present

## 2023-11-06 NOTE — ED Triage Notes (Addendum)
 First Nurse Note:  Pt via [facility] from home. Pt c/o down her basement steps about 8 steps. Denies head injury. Denies LOC. Denies blood thinners. Pt c/o L ankle pain. Swelling and bruising noted to the L ankle. Pt is A&Ox4 and NAD   EMS reports 136/65 BP, 89 CBG, 65 HR, 100% on RA

## 2023-11-06 NOTE — Discharge Instructions (Signed)
 You can take 650 mg of Tylenol and 600 mg of ibuprofen every 6 hours as needed for pain. You can use ice, heat, muscle creams and other topical pain relievers as well.  I encourage you to elevate your ankle and wear the Ace bandage as this will help decrease your swelling which will improve your pain.  Wash the scrapes on your shins and ankle with soap and water, pat dry and apply a bandage.  You can put a triple antibiotic ointment on it if you prefer.

## 2023-11-06 NOTE — ED Notes (Signed)
 See triage note  Presents s/p fall  States she was coming down the steps  Missed a step  And fell down approx 8 steps Abrasions note to left ankle and right lower leg   Bruising noted  Good pulses

## 2023-11-06 NOTE — ED Provider Notes (Signed)
 [facility] Provider Note    Event Date/Time   First MD Initiated Contact with Patient 11/06/23 1048     (approximate)   History   Fall and Ankle Pain   HPI  Charlotte Wright is a 73 y.o. female with PMH of anxiety, ovarian cyst, osteoporosis presents for evaluation of left ankle pain.  Patient states she was rushing down the stairs to answer the door when she slipped on the stairs and fell.  She did not hit her head.  Has pain to the left ankle and multiple abrasions to both shins.      Physical Exam   Triage Vital Signs: ED Triage Vitals  Encounter Vitals Group     BP 11/06/23 1035 119/75     Girls Systolic BP Percentile --      Girls Diastolic BP Percentile --      Boys Systolic BP Percentile --      Boys Diastolic BP Percentile --      Pulse Rate 11/06/23 1035 65     Resp 11/06/23 1035 18     Temp 11/06/23 1035 (!) 97.5 F (36.4 C)     Temp Source 11/06/23 1035 Oral     SpO2 11/06/23 1035 100 %     Weight 11/06/23 1036 180 lb (81.6 kg)     Height 11/06/23 1036 5' 6 (1.676 m)     Head Circumference --      Peak Flow --      Pain Score --      Pain Loc --      Pain Education --      Exclude from Growth Chart --     Most recent vital signs: Vitals:   11/06/23 1035  BP: 119/75  Pulse: 65  Resp: 18  Temp: (!) 97.5 F (36.4 C)  SpO2: 100%   General: Awake, no distress.  CV:  Good peripheral perfusion.  Resp:  Normal effort.  Abd:  No distention.  Other:  Multiple abrasions to the anterior right shin and anterior left ankle, left ankle has some bruising and swelling, very mild tenderness to palpation, left ankle range of motion is well-maintained and patient is able to walk several steps around the room without difficulty.   ED Results / Procedures / Treatments   Labs (all labs ordered are listed, but only abnormal results are displayed) Labs Reviewed - No data to display   PROCEDURES:  Critical Care performed:  No  Procedures   MEDICATIONS ORDERED IN ED: Medications - No data to display   IMPRESSION / MDM / ASSESSMENT AND PLAN / ED COURSE  I reviewed the triage vital signs and the nursing notes.                             73 year old female presents for evaluation of left ankle pain after a fall.  Vital signs are stable patient NAD on exam.  Differential diagnosis includes, but is not limited to, abrasion, contusion, strain, sprain, fracture, dislocation.  Patient's presentation is most consistent with acute, uncomplicated illness.  Exam is very reassuring.  Given that patient can walk around the room without any difficulty very low suspicion for fracture.  Will not obtain x-ray today.  Offered to clean patient's abrasions for her and she declined.  Since her left ankle is a bit swollen will apply an Ace bandage for some compression.  He had minimal tenderness to palpation,  suspect contusion is the cause of her pain and ligamentous injury less likely.  Advised Tylenol, ibuprofen, ice and elevation.  Discussed wound care.  Patient voiced understanding, all questions were answered and she was stable at discharge.      FINAL CLINICAL IMPRESSION(S) / ED DIAGNOSES   Final diagnoses:  Acute left ankle pain     Rx / DC Orders   ED Discharge Orders     None        Note:  This document was prepared using Dragon voice recognition software and may include unintentional dictation errors.   Cleaster Tinnie LABOR, PA-C 11/06/23 1106    Dicky Anes, MD 11/06/23 [phone]

## 2023-11-11 ENCOUNTER — Encounter: Admitting: Obstetrics

## 2023-11-14 NOTE — Progress Notes (Signed)
    OBSTETRICS/GYNECOLOGY POST-OPERATIVE CLINIC VISIT  Subjective:     Charlotte Wright is a 73 y.o. female who presents to the clinic 3 weeks status post hysteroscopy and D&C for postmenopausal bleeding, thickened endometrium, and benign endometrial polyp. Eating a regular diet without difficulty. Bowel movements are normal. The patient is not having any pain.She reports bleeding immediately following procedure and then had some brown spotting a few days ago, but no further bleeding.   The following portions of the patient's history were reviewed and updated as appropriate: allergies, current medications, past family history, past medical history, past social history, past surgical history, and problem list.  Review of Systems Pertinent items are noted in HPI.   Objective:   BP 120/84   Pulse 88   Wt 182 lb 9.6 oz (82.8 kg)   BMI 29.47 kg/m  Body mass index is 29.47 kg/m.  General:  alert and no distress  Abdomen: soft, bowel sounds active, non-tender  Neuro:   No focal deficits    Pathology:   SURGICAL PATHOLOGY  FINAL DIAGNOSIS       1. Endometrium, curettage,  :      FEATURES SUGGESTIVE OF ENDOMETRIAL POLYP.      BACKGROUND INACTIVE ENDOMETRIUM.      NEGATIVE FOR HYPERPLASIA, ATYPIA OR MALIGNANCY.    Assessment:   73 y.o. G5P5 with PMB s/p D&C, polypectomy, doing well.     Plan:   1. Continue any current medications as instructed by provider. 2. Wound care discussed. 3. Operative findings again reviewed. Pathology report discussed. 4. Activity restrictions: none 5. Anticipated return to work: not applicable. 6. Follow up: prn; if PMB returns   Estil Mangle, DO [facility]

## 2023-11-18 ENCOUNTER — Ambulatory Visit (INDEPENDENT_AMBULATORY_CARE_PROVIDER_SITE_OTHER): Admitting: Obstetrics

## 2023-11-18 ENCOUNTER — Encounter: Payer: Self-pay | Admitting: Obstetrics

## 2023-11-18 VITALS — BP 120/84 | HR 88 | Wt 182.6 lb

## 2023-11-18 DIAGNOSIS — Z4889 Encounter for other specified surgical aftercare: Secondary | ICD-10-CM

## 2024-09-21 ENCOUNTER — Ambulatory Visit

## 2024-09-24 ENCOUNTER — Ambulatory Visit
